# Patient Record
Sex: Male | Born: 1975
Health system: Southern US, Community
[De-identification: ages and names within clinical notes are randomized; demographics above are authoritative.]

## PROBLEM LIST (undated history)

## (undated) DIAGNOSIS — R51 Headache: Secondary | ICD-10-CM

## (undated) DIAGNOSIS — R519 Headache, unspecified: Secondary | ICD-10-CM

## (undated) HISTORY — PX: CERVICAL FUSION: SHX112

## (undated) HISTORY — PX: MYRINGOTOMY WITH TUBE PLACEMENT: SHX5663

---

## 2002-12-10 ENCOUNTER — Encounter: Payer: Self-pay | Admitting: Emergency Medicine

## 2002-12-10 ENCOUNTER — Emergency Department (HOSPITAL_COMMUNITY): Admission: EM | Admit: 2002-12-10 | Discharge: 2002-12-10 | Payer: Self-pay | Admitting: Emergency Medicine

## 2003-01-25 ENCOUNTER — Emergency Department (HOSPITAL_COMMUNITY): Admission: EM | Admit: 2003-01-25 | Discharge: 2003-01-25 | Payer: Self-pay | Admitting: Emergency Medicine

## 2003-01-26 ENCOUNTER — Encounter: Payer: Self-pay | Admitting: Emergency Medicine

## 2003-10-09 ENCOUNTER — Emergency Department (HOSPITAL_COMMUNITY): Admission: EM | Admit: 2003-10-09 | Discharge: 2003-10-09 | Payer: Self-pay

## 2005-04-07 ENCOUNTER — Emergency Department (HOSPITAL_COMMUNITY): Admission: EM | Admit: 2005-04-07 | Discharge: 2005-04-07 | Payer: Self-pay | Admitting: Emergency Medicine

## 2005-04-17 ENCOUNTER — Emergency Department (HOSPITAL_COMMUNITY): Admission: EM | Admit: 2005-04-17 | Discharge: 2005-04-17 | Payer: Self-pay | Admitting: Emergency Medicine

## 2005-04-24 ENCOUNTER — Emergency Department (HOSPITAL_COMMUNITY): Admission: EM | Admit: 2005-04-24 | Discharge: 2005-04-24 | Payer: Self-pay | Admitting: Emergency Medicine

## 2006-05-04 ENCOUNTER — Emergency Department (HOSPITAL_COMMUNITY): Admission: EM | Admit: 2006-05-04 | Discharge: 2006-05-04 | Payer: Self-pay | Admitting: Emergency Medicine

## 2006-05-14 ENCOUNTER — Encounter: Admission: RE | Admit: 2006-05-14 | Discharge: 2006-05-14 | Payer: Self-pay | Admitting: Neurosurgery

## 2006-05-29 ENCOUNTER — Inpatient Hospital Stay (HOSPITAL_COMMUNITY): Admission: RE | Admit: 2006-05-29 | Discharge: 2006-06-02 | Payer: Self-pay | Admitting: Neurosurgery

## 2007-12-16 ENCOUNTER — Emergency Department (HOSPITAL_COMMUNITY): Admission: EM | Admit: 2007-12-16 | Discharge: 2007-12-16 | Payer: Self-pay | Admitting: Emergency Medicine

## 2007-12-18 ENCOUNTER — Emergency Department (HOSPITAL_COMMUNITY): Admission: EM | Admit: 2007-12-18 | Discharge: 2007-12-18 | Payer: Self-pay | Admitting: Emergency Medicine

## 2010-12-07 NOTE — Discharge Summary (Signed)
NAME:  Malik Hernandez, Malik Hernandez                 ACCOUNT NO.:  [id number]   MEDICAL RECORD NO.:  [id number]          PATIENT TYPE:  INP   LOCATION:  3005                         FACILITY:  [facility]   PHYSICIAN:  Coletta Memos, M.D.     DATE OF BIRTH:  Jun 02, 1976   DATE OF ADMISSION:  05/29/2006  DATE OF DISCHARGE:  06/02/2006                                 DISCHARGE SUMMARY   ADMITTING DIAGNOSES:  C3-4, C4-5, C5-6 spondylosis, stenosis, cervical  spondylosis with cervical myelopathy.   POSTOPERATIVE DIAGNOSES:  C3-4, C4-5, C5-6 spondylosis, stenosis, cervical  spondylosis with cervical myelopathy.   PROCEDURE:  1. Anterior cervical diskectomy and fusion C3-C6.  2. PEAK __________  3. Anterior plating C3-C6.   INDICATIONS:  [name] is a 35 year old black gentleman with history of  cervical spondylosis with myelopathy.  He had evidence of cervical stenosis  on his MRI.  He was taken to the operating room and decompressed for that  problem.  Postoperatively, frankly he has done well.  He has and slow to  progress but has had no problems.  At discharge, he is tolerating a regular  diet, walking, voiding.  His wound is clean, dry, and there are no signs of  infection.  He was discharged home with Percocet for pain, Valium for muscle  relaxant. He will get a return appointment see [place] in approximately three  full weeks.           ______________________________  Coletta Memos, M.D.     KC/MEDQ  D:  06/02/2006  T:  06/02/2006  Job:  [id number]

## 2010-12-07 NOTE — Op Note (Signed)
NAME:  Malik Hernandez, Malik Hernandez                 ACCOUNT NO.:  [id number]   MEDICAL RECORD NO.:  [id number]          PATIENT TYPE:  INP   LOCATION:  3005                         FACILITY:  [facility]   PHYSICIAN:  Cristi Loron, M.D.DATE OF BIRTH:  Jul 10, 1976   DATE OF PROCEDURE:  05/29/2006  DATE OF DISCHARGE:                                 OPERATIVE REPORT   PREOPERATIVE DIAGNOSES:  C3-4, C4-5, C5-6 spondylosis, herniated nucleus  pulposus, stenosis, cervicalgia, cervical myelopathy, cervical  radiculopathy.   POSTOPERATIVE DIAGNOSES:  C3-4, C4-5, C5-6 spondylosis, herniated nucleus  pulposus, stenosis, cervicalgia, cervical myelopathy, cervical  radiculopathy.   PROCEDURES:  C3-4, C4-5, C5-6 extensive anterior cervical  diskectomy/decompression; C3-4, C4-5, C5-6 interbody arthrodesis with  Alphatec putty and local morcellized autograft bone; insertion of interbody  prosthesis (Alphatec PEEK prosthesis) at C3-4, 4-5 and 5-6; anterior  cervical plating, C3 to C6   SURGEON:  Cristi Loron, M.D.   ASSISTANT:  Coletta Memos, M.D.   ANESTHESIA:  General endotracheal.   ESTIMATED BLOOD LOSS:  150 cc.   SPECIMENS:  None.   DRAINS:  One 7-mm Jackson-Pratt drain in the prevertebral space.   COMPLICATIONS:  None.   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  The  patient was worked up with a cervical MRI, which demonstrated severe  stenosis at C3-4, 4-5 and 5-6 with herniated disks, spondylosis, etc., and  evidence of spinal cord gliosis.  I recommended surgery, and the patient has  weighed the risks, benefits and alternatives of surgery and decided to  proceed with a 3-level anterior cervical diskectomy, fusion and plating with  Codman Slimlock titanium plate and screws.   DESCRIPTION OF PROCEDURES:  The patient was brought to the operating room by  the anesthesia team.  General  endotracheal anesthesia was induced.  The  patient remained in a supine position.  A roll was placed under the  shoulders to place the neck in slight extension.  His anterior cervical  region was then prepared with Betadine scrub and Betadine solution.  Sterile  drapes were applied.  I then injected the area to be incised with Marcaine  with epinephrine solution.  I used a scalpel to make a transverse incision  in the patient's left anterior neck.  I used the Metzenbaum scissors to  divide the platysma, and then to dissect medial to the sternocleidomastoid  muscle, jugular vein and carotid artery.  I carefully dissected down towards  the anterior cervical spine and carefully identified the esophagus and  retracted it medially with the handheld retractors.  We used the Kitner  swabs to clear the soft tissue from the anterior cervical spine, and then  inserted a bent spinal needle into the upper exposed intervertebral disk  space.  We continued to check by radiograph to confirm our location.   We then used electrocautery to detach the medial border of the longus colli  muscle bilaterally from the C3-4, 4-5 and 5-6 intervertebral disk spaces.  We inserted the Caspar self-retaining retractor  for exposure.  We began at  C5.  We incised the C4-5 intervertebral disk with a 15-blade scalpel and  performed a partial intervertebral diskectomy using the pituitary forceps  and the Karlin curets.  We then inserted distraction screws at C4 and C5 and  then distracted the interspace.  Then, we brought the operating microscope  into the field, and under its magnification and illumination, we completed  the microdissection/decompression.  We used the high-speed drill to  decorticate the vertebral endplates at C4-5, drill away the remainder of the  C4-5 intervertebral disk, drill away some posterior spondylosis; there was  considerable posterior spondylosis.  We thinned down the posterior  longitudinal  ligament with the drill, and then we incised the ligament with  the arachnoid knife and then removed it with the Kerrison punch,  undercutting the vertebral endplates at C4-5, decompressing the thecal sac.  We then performed a foraminotomy about the bilateral C5 nerve roots,  completing the decompression at this level.   We then repeated this procedure in analogous fashion at C3-4, and then at C5-  6.   Having completed the decompression at C3-4, 4-5, and 5-6, we now turned our  attention to arthrodesis.  We obtained Alphatec medium PEEK interbody  spacers, 5 mm in height.  We prefilled them with a combination of local,  morcellized autograft bone, which was obtained during the decompression, and  cleared off the soft tissue, was Grafton bone graft extender.  We prefilled  these PEEK prostheses with the autograft bone and the putty, and then  inserted them in to distract the C3-4, 4-5 and 5-6 interspaces.  We removed  the distractor screws.  There was a good snug fit of the prosthesis at each  level.   We now turned our attention to anterior spinal instrumentation.  We used a  high-speed drill to remove some ventral spondylosis from the vertebral  endplates at C3-4, 4-5 and 5-6 so that a plate would lie down flat.  We  selected an appropriate length Codman Slimlock anterior cervical plate and  laid it along the anterior aspect of the vertebral bodies from C3 to C6.  We  then drilled two 14-mm holes at C3, 4, 5 and 6.  We secured the plate to the  intervertebral bodies by placing two 14-mm self-tapping screws at C3, 4, 5  and 6.  We then obtained intraoperative radiograph that demonstrated good  position of the plate and screws and interbody prosthesis.  We, therefore,  secured the screws to the plate by locking each cam.   We then obtained hemostasis using bipolar electrocautery.  We irrigated the wound out with Bacitracin solution.  We removed the Caspar retractor, and  then we  inspected the esophagus for damage.  There was none apparent.  We  then reapproximated the patient's platysma muscle with interrupted 3-0  Vicryl suture, the subcutaneous tissue with interrupted 3-0 Vicryl suture,  and the skin with Steri-Strips and benzoin.  I should mention that we placed  a 7-mm Jackson-Pratt drain in the prevertebral space and tunneled it out  through a separate stab wound.  We then reapproximated the patient's skin  with Steri-Strips and benzoin.  The wound was then coated with Bacitracin  ointment.  A sterile dressing was applied.  The drapes were removed, and the  patient was subsequently extubated by the anesthesia team and transported to  the postanesthesia care unit in stable condition.  All sponge, instrument  and needle counts were correct  at the end of this case.     Cristi Loron, M.D.  Electronically Signed    JDJ/MEDQ  D:  05/29/2006  T:  05/30/2006  Job:  [id number]   cc:   Coletta Memos, M.D.

## 2011-03-12 ENCOUNTER — Emergency Department (HOSPITAL_COMMUNITY): Payer: BC Managed Care – PPO

## 2011-03-12 ENCOUNTER — Emergency Department (HOSPITAL_COMMUNITY)
Admission: EM | Admit: 2011-03-12 | Discharge: 2011-03-13 | Disposition: A | Discharge status: Home / Self Care | Payer: BC Managed Care – PPO | Attending: Emergency Medicine | Admitting: Emergency Medicine

## 2011-03-12 DIAGNOSIS — S93409A Sprain of unspecified ligament of unspecified ankle, initial encounter: Secondary | ICD-10-CM | POA: Insufficient documentation

## 2011-03-12 DIAGNOSIS — Y9239 Other specified sports and athletic area as the place of occurrence of the external cause: Secondary | ICD-10-CM | POA: Insufficient documentation

## 2011-03-12 DIAGNOSIS — X500XXA Overexertion from strenuous movement or load, initial encounter: Secondary | ICD-10-CM | POA: Insufficient documentation

## 2011-03-12 DIAGNOSIS — M25473 Effusion, unspecified ankle: Secondary | ICD-10-CM | POA: Insufficient documentation

## 2011-03-12 DIAGNOSIS — M25579 Pain in unspecified ankle and joints of unspecified foot: Secondary | ICD-10-CM | POA: Insufficient documentation

## 2011-03-12 DIAGNOSIS — S8990XA Unspecified injury of unspecified lower leg, initial encounter: Secondary | ICD-10-CM | POA: Insufficient documentation

## 2011-03-12 DIAGNOSIS — Y9367 Activity, basketball: Secondary | ICD-10-CM | POA: Insufficient documentation

## 2011-03-12 DIAGNOSIS — M25476 Effusion, unspecified foot: Secondary | ICD-10-CM | POA: Insufficient documentation

## 2012-03-30 DIAGNOSIS — M5430 Sciatica, unspecified side: Secondary | ICD-10-CM | POA: Insufficient documentation

## 2012-03-30 DIAGNOSIS — Z981 Arthrodesis status: Secondary | ICD-10-CM | POA: Insufficient documentation

## 2012-03-30 DIAGNOSIS — F17200 Nicotine dependence, unspecified, uncomplicated: Secondary | ICD-10-CM | POA: Insufficient documentation

## 2012-03-31 ENCOUNTER — Emergency Department (HOSPITAL_COMMUNITY)
Admission: EM | Admit: 2012-03-31 | Discharge: 2012-03-31 | Disposition: A | Discharge status: Home / Self Care | Payer: BC Managed Care – PPO | Attending: Emergency Medicine | Admitting: Emergency Medicine

## 2012-03-31 ENCOUNTER — Encounter (HOSPITAL_COMMUNITY): Payer: Self-pay

## 2012-03-31 DIAGNOSIS — M5430 Sciatica, unspecified side: Secondary | ICD-10-CM

## 2012-03-31 MED ORDER — HYDROCODONE-ACETAMINOPHEN 5-325 MG PO TABS: 15.0000 | ORAL_TABLET | ORAL | Status: DC | PRN

## 2012-03-31 MED ORDER — IBUPROFEN 200 MG PO TABS
ORAL_TABLET | ORAL | Status: AC
  Administered 2012-03-31: 600 mg via ORAL
  Filled 2012-03-31: qty 3

## 2012-03-31 MED ORDER — CYCLOBENZAPRINE HCL 10 MG PO TABS: 10.0000 mg | ORAL_TABLET | Freq: Two times a day (BID) | ORAL | Status: AC | PRN

## 2012-03-31 MED ORDER — IBUPROFEN 200 MG PO TABS
600.0000 mg | ORAL_TABLET | Freq: Once | ORAL | Status: AC
  Administered 2012-03-31: 600 mg via ORAL

## 2012-03-31 NOTE — ED Notes (Signed)
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

## 2012-03-31 NOTE — ED Provider Notes (Signed)
History     CSN: [id number]  Arrival date & time 03/30/12  2358   First MD Initiated Contact with Patient 03/31/12 [phone]      Chief Complaint  Patient presents with  . 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  History reviewed. No pertinent past medical history.  Past Surgical History  Procedure Date  . Cervical fusion     No family history on file.  History  Substance Use Topics  . Smoking status: Current Some Day Smoker    Types: Cigars  . Smokeless tobacco: Not on file  . Alcohol Use: Yes     social      Review of Systems  Allergies  Review of patient's allergies indicates no known allergies.  Home Medications   Current Outpatient Rx  Name Route Sig Dispense Refill  . CYCLOBENZAPRINE HCL 10 MG PO TABS Oral Take 1 tablet (10 mg total) by mouth 2 (two) times daily as needed for muscle spasms. 20 tablet 0  . HYDROCODONE-ACETAMINOPHEN 5-325 MG PO TABS Oral Take 15 tablets by mouth every 4 (four) hours as needed for pain. 6 tablet 0    BP 117/69  Pulse 71  Temp 98 F (36.7 C) (Oral)  Resp 20  Ht 5\' 9"  (1.753 m)  Wt 185 lb (83.915 kg)  BMI 27.32 kg/m2  SpO2 99%  Physical Exam  Nursing note and vitals reviewed. Constitutional: He is oriented to person, place, and time. He appears well-developed. No distress.  HENT:  Head: Normocephalic and atraumatic.  Eyes: Pupils are equal, round, and reactive to light.  Neck: Normal range of motion.  Cardiovascular: Normal rate and intact distal pulses.   Pulmonary/Chest: No respiratory distress.  Abdominal: Normal appearance. He exhibits no distension.  Musculoskeletal: Normal range of motion.       Back:  Neurological: He is alert and oriented to person, place, and time. He displays normal reflexes. No cranial nerve deficit.   Skin: Skin is warm and dry. No rash noted.  Psychiatric: He has a normal mood and affect. His behavior is normal.    ED Course  Procedures (including critical care time)  Labs Reviewed - No data to display No results found.   1. Sciatica       MDM          Nelia Shi, MD 03/31/12 1600

## 2012-03-31 NOTE — ED Notes (Signed)
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

## 2012-04-04 ENCOUNTER — Emergency Department (HOSPITAL_COMMUNITY)
Admission: EM | Admit: 2012-04-04 | Discharge: 2012-04-04 | Disposition: A | Discharge status: Home / Self Care | Payer: BC Managed Care – PPO | Attending: Emergency Medicine | Admitting: Emergency Medicine

## 2012-04-04 ENCOUNTER — Encounter (HOSPITAL_COMMUNITY): Payer: Self-pay | Admitting: Emergency Medicine

## 2012-04-04 DIAGNOSIS — F17200 Nicotine dependence, unspecified, uncomplicated: Secondary | ICD-10-CM | POA: Insufficient documentation

## 2012-04-04 DIAGNOSIS — M5430 Sciatica, unspecified side: Secondary | ICD-10-CM | POA: Insufficient documentation

## 2012-04-04 DIAGNOSIS — M5432 Sciatica, left side: Secondary | ICD-10-CM

## 2012-04-04 MED ORDER — PREDNISONE 20 MG PO TABS
60.0000 mg | ORAL_TABLET | Freq: Once | ORAL | Status: AC
  Administered 2012-04-04: 60 mg via ORAL
  Filled 2012-04-04: qty 3

## 2012-04-04 MED ORDER — DIAZEPAM 5 MG PO TABS: 5.0000 mg | ORAL_TABLET | Freq: Three times a day (TID) | ORAL | Status: AC | PRN

## 2012-04-04 MED ORDER — HYDROCODONE-ACETAMINOPHEN 5-325 MG PO TABS: 1.0000 | ORAL_TABLET | Freq: Every evening | ORAL | Status: AC | PRN

## 2012-04-04 MED ORDER — DIAZEPAM 5 MG PO TABS
5.0000 mg | ORAL_TABLET | Freq: Once | ORAL | Status: AC
  Administered 2012-04-04: 5 mg via ORAL
  Filled 2012-04-04: qty 1

## 2012-04-04 MED ORDER — PREDNISONE 20 MG PO TABS: ORAL_TABLET | ORAL | Status: AC

## 2012-04-04 NOTE — ED Notes (Signed)
Pt c/o L lower back pain radiating down L thigh to lower leg. Also c/o L numbness/tingling. Denies injury. Pt states seen here for same recently. No better

## 2012-04-04 NOTE — ED Provider Notes (Signed)
History     CSN: [id number]  Arrival date & time 04/04/12  1191   First MD Initiated Contact with Patient 04/04/12 0308      Chief Complaint  Patient presents with  . Back Pain    (Consider location/radiation/quality/duration/timing/severity/associated sxs/prior treatment) HPI Comments: Patient was recently diagnosed with sciatica, radiating down his left leg.  He was treated with Flexeril, and hydrocodone, and received no relief.  He still having, exacerbation, or pain, numbness and tingling.  That starts in his mid left buttock radiating down the lateral thigh, to the knee.  He denies any known injury, but he does move furniture for a living.  Patient is a 36 y.o. male presenting with back pain. The history is provided by the patient.  Back Pain  This is a recurrent problem. The current episode started more than 1 week ago. The problem occurs constantly. The problem has been gradually worsening. The pain is associated with no known injury. The pain is present in the lumbar spine. The quality of the pain is described as aching. The pain radiates to the left thigh. The pain is at a severity of 7/10. The pain is moderate. The symptoms are aggravated by certain positions. Pertinent negatives include no numbness, no bowel incontinence, no perianal numbness, no bladder incontinence, no dysuria, no paresis, no tingling and no weakness.    History reviewed. No pertinent past medical history.  Past Surgical History  Procedure Date  . Cervical fusion     No family history on file.  History  Substance Use Topics  . Smoking status: Current Some Day Smoker    Types: Cigars  . Smokeless tobacco: Not on file  . Alcohol Use: Yes     social      Review of Systems  Respiratory: Negative for shortness of breath.   Gastrointestinal: Negative for nausea, diarrhea and bowel incontinence.  Genitourinary: Negative for bladder incontinence and dysuria.  Musculoskeletal: Positive for back pain.   Skin: Negative for rash and wound.  Neurological: Negative for dizziness, tingling, weakness and numbness.    Allergies  Review of patient's allergies indicates no known allergies.  Home Medications   Current Outpatient Rx  Name Route Sig Dispense Refill  . CYCLOBENZAPRINE HCL 10 MG PO TABS Oral Take 1 tablet (10 mg total) by mouth 2 (two) times daily as needed for muscle spasms. 20 tablet 0  . DIAZEPAM 5 MG PO TABS Oral Take 1 tablet (5 mg total) by mouth every 8 (eight) hours as needed for anxiety. 30 tablet 0  . HYDROCODONE-ACETAMINOPHEN 5-325 MG PO TABS Oral Take 1 tablet by mouth at bedtime and may repeat dose one time if needed. 6 tablet 0  . PREDNISONE 20 MG PO TABS  3 Tabs PO Days 1-3, then 2 tabs PO Days 4-6, then 1 tab PO Day 7-9, then Half Tab PO Day 10-12 20 tablet 0    BP 125/78  Pulse 81  Temp 98.7 F (37.1 C) (Oral)  Resp 16  SpO2 100%  Physical Exam  Constitutional: He is oriented to person, place, and time. He appears well-developed and well-nourished.  HENT:  Head: Normocephalic.  Eyes: Pupils are equal, round, and reactive to light.  Neck: Normal range of motion.  Pulmonary/Chest: Effort normal.  Abdominal: Soft. There is no tenderness.  Musculoskeletal: Normal range of motion. He exhibits no tenderness.       Lumbar back: He exhibits tenderness. He exhibits normal range of motion, no bony tenderness, no deformity,  no pain and no spasm.       Pain left SI joint  Neurological: He is alert and oriented to person, place, and time.  Skin: Skin is warm. No rash noted. No erythema.    ED Course  Procedures (including critical care time)  Labs Reviewed - No data to display No results found.   1. Sciatica of left side       MDM  Persistent sciatica, treated with Flexeril, and Vicodin without relief.  Will change to Valium, prednisone, over a 12 day taper, and Vicodin at night         Arman Filter, NP 04/04/12 0401  Arman Filter,  NP 04/04/12 3086

## 2012-04-04 NOTE — ED Notes (Signed)
NP at bedside.

## 2012-04-04 NOTE — ED Provider Notes (Signed)
Medical screening examination/treatment/procedure(s) were performed by non-physician practitioner and as supervising physician I was immediately available for consultation/collaboration.  Olivia Mackie, MD 04/04/12 [phone]

## 2013-02-07 ENCOUNTER — Encounter (HOSPITAL_COMMUNITY): Payer: Self-pay | Admitting: Emergency Medicine

## 2013-02-07 ENCOUNTER — Emergency Department (HOSPITAL_COMMUNITY)
Admission: EM | Admit: 2013-02-07 | Discharge: 2013-02-08 | Disposition: A | Discharge status: Home / Self Care | Payer: Self-pay | Attending: Emergency Medicine | Admitting: Emergency Medicine

## 2013-02-07 DIAGNOSIS — H6001 Abscess of right external ear: Secondary | ICD-10-CM

## 2013-02-07 DIAGNOSIS — H60399 Other infective otitis externa, unspecified ear: Secondary | ICD-10-CM | POA: Insufficient documentation

## 2013-02-07 DIAGNOSIS — Y929 Unspecified place or not applicable: Secondary | ICD-10-CM | POA: Insufficient documentation

## 2013-02-07 DIAGNOSIS — L089 Local infection of the skin and subcutaneous tissue, unspecified: Secondary | ICD-10-CM | POA: Insufficient documentation

## 2013-02-07 DIAGNOSIS — F17200 Nicotine dependence, unspecified, uncomplicated: Secondary | ICD-10-CM | POA: Insufficient documentation

## 2013-02-07 DIAGNOSIS — Y9389 Activity, other specified: Secondary | ICD-10-CM | POA: Insufficient documentation

## 2013-02-07 NOTE — ED Notes (Signed)
C/o abscess to R ear x 1 week.  Pt states he thinks he was bit by something at work.

## 2013-02-08 NOTE — ED Provider Notes (Signed)
   History    CSN: [id number] Arrival date & time 02/07/13  2317  First MD Initiated Contact with Patient 02/07/13 2354     Chief Complaint  Patient presents with  . Insect Bite   (Consider location/radiation/quality/duration/timing/severity/associated sxs/prior Treatment) HPI Comments: Patient noticed a week ago, that he had a small lesion on his right earlobe that is progressively gotten larger, and draining intermittently since that time.  It is painful.  Denies any fever or headache`  The history is provided by the patient.   History reviewed. No pertinent past medical history. Past Surgical History  Procedure Laterality Date  . Cervical fusion     No family history on file. History  Substance Use Topics  . Smoking status: Current Some Day Smoker    Types: Cigars  . Smokeless tobacco: Not on file  . Alcohol Use: Yes     Comment: social    Review of Systems  Constitutional: Negative for fever and chills.  Skin: Positive for wound.  All other systems reviewed and are negative.    Allergies  Review of patient's allergies indicates no known allergies.  Home Medications  No current outpatient prescriptions on file. BP 121/63  Pulse 78  Temp(Src) 98.3 F (36.8 C) (Oral)  Resp 16  SpO2 100% Physical Exam  Nursing note and vitals reviewed. Constitutional: He appears well-developed and well-nourished.  HENT:  Head: Normocephalic.  Right Ear: External ear normal.  Left Ear: External ear normal.  Ears:  Eyes: Pupils are equal, round, and reactive to light.  Neck: Normal range of motion.  Cardiovascular: Normal rate.   Pulmonary/Chest: Effort normal.  Musculoskeletal: Normal range of motion.  Skin: Skin is warm and dry.    ED Course  INCISION AND DRAINAGE Date/Time: 02/08/2013 12:23 AM Performed by: Arman Filter Authorized by: Arman Filter Consent: Verbal consent obtained. Consent given by: patient Patient understanding: patient states understanding  of the procedure being performed Patient identity confirmed: verbally with patient Type: abscess Body area: head/neck (Left earlobe) Anesthetic total: 0 ml Wound treatment: wound left open Patient tolerance: Patient tolerated the procedure well with no immediate complications. Comments: Material manually expressed large core, removed.  No further purulent material   (including critical care time) Labs Reviewed - No data to display No results found. 1. Abscess of earlobe, right     MDM   Abscess of the right earlobe manually expressed.  No further purulent material visible.  Patient was instructed to place warm soaks for the next one to 2 days  Arman Filter, NP 02/08/13 0025  Arman Filter, NP 02/08/13 0025

## 2013-02-08 NOTE — ED Provider Notes (Signed)
Medical screening examination/treatment/procedure(s) were performed by non-physician practitioner and as supervising physician I was immediately available for consultation/collaboration.  Mikaili Flippin M Keithen Capo, MD 02/08/13 0648 

## 2013-11-12 ENCOUNTER — Encounter (HOSPITAL_COMMUNITY): Payer: Self-pay | Admitting: Emergency Medicine

## 2013-11-12 ENCOUNTER — Emergency Department (HOSPITAL_COMMUNITY)
Admission: EM | Admit: 2013-11-12 | Discharge: 2013-11-12 | Disposition: A | Discharge status: Home / Self Care | Payer: BC Managed Care – PPO | Attending: Emergency Medicine | Admitting: Emergency Medicine

## 2013-11-12 DIAGNOSIS — Z79899 Other long term (current) drug therapy: Secondary | ICD-10-CM | POA: Insufficient documentation

## 2013-11-12 DIAGNOSIS — Z7982 Long term (current) use of aspirin: Secondary | ICD-10-CM | POA: Insufficient documentation

## 2013-11-12 DIAGNOSIS — Z981 Arthrodesis status: Secondary | ICD-10-CM | POA: Insufficient documentation

## 2013-11-12 DIAGNOSIS — M545 Low back pain, unspecified: Secondary | ICD-10-CM | POA: Insufficient documentation

## 2013-11-12 DIAGNOSIS — F17200 Nicotine dependence, unspecified, uncomplicated: Secondary | ICD-10-CM | POA: Insufficient documentation

## 2013-11-12 DIAGNOSIS — Z791 Long term (current) use of non-steroidal anti-inflammatories (NSAID): Secondary | ICD-10-CM | POA: Insufficient documentation

## 2013-11-12 DIAGNOSIS — R300 Dysuria: Secondary | ICD-10-CM | POA: Insufficient documentation

## 2013-11-12 LAB — URINALYSIS, ROUTINE W REFLEX MICROSCOPIC
Bilirubin Urine: NEGATIVE
Glucose, UA: NEGATIVE mg/dL
Hgb urine dipstick: NEGATIVE
KETONES UR: NEGATIVE mg/dL
LEUKOCYTES UA: NEGATIVE
NITRITE: NEGATIVE
PH: 5.5 (ref 5.0–8.0)
PROTEIN: NEGATIVE mg/dL
Specific Gravity, Urine: 1.027 (ref 1.005–1.030)
UROBILINOGEN UA: 1 mg/dL (ref 0.0–1.0)

## 2013-11-12 MED ORDER — OXYCODONE-ACETAMINOPHEN 5-325 MG PO TABS
2.0000 | ORAL_TABLET | Freq: Once | ORAL | Status: AC
  Administered 2013-11-12: 2 via ORAL
  Filled 2013-11-12: qty 2

## 2013-11-12 MED ORDER — CYCLOBENZAPRINE HCL 10 MG PO TABS: 10.0000 mg | ORAL_TABLET | Freq: Three times a day (TID) | ORAL | Status: DC | PRN

## 2013-11-12 MED ORDER — OXYCODONE-ACETAMINOPHEN 5-325 MG PO TABS: 2.0000 | ORAL_TABLET | Freq: Four times a day (QID) | ORAL | Status: DC | PRN

## 2013-11-12 NOTE — ED Notes (Signed)
Patient up to bathroom

## 2013-11-12 NOTE — ED Provider Notes (Signed)
CSN: [id number]     Arrival date & time 11/12/13  0031 History   First MD Initiated Contact with Patient 11/12/13 0116     Chief Complaint  Patient presents with  . Back Pain  . 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 He is here with a one-week history of left lower back pain. The pain is sharp and worse with movement. It does not radiate. He states his become severe at its worst. He denies numbness or weakness in his legs. He denies nausea, vomiting or diarrhea. He has had some mild dysuria but no urethral discharge.  History reviewed. No pertinent past medical history. Past Surgical History  Procedure Laterality Date  . Cervical fusion     History reviewed. No pertinent family history. History  Substance Use Topics  . Smoking status: Current Some Day Smoker    Types: Cigars  . Smokeless tobacco: Not on file  . Alcohol Use: Yes     Comment: social    Review of Systems  All other systems reviewed and are negative.  Allergies  Review of patient's allergies indicates no known allergies.  Home Medications   Prior to Admission medications   Medication Sig Start Date End Date Taking? Authorizing Provider  Aspirin-Caffeine (BAYER BACK & BODY PAIN EX ST) 500-32.5 MG TABS Take 2 tablets by mouth every 6 (six) hours as needed (pain).   Yes Historical Provider, MD  methocarbamol (ROBAXIN) 500 MG tablet Take 500 mg by mouth once.   Yes Historical Provider, MD  naproxen sodium (ANAPROX) 220 MG tablet Take 440 mg by mouth 2 (two) times daily as needed (pain).   Yes Historical Provider, MD  oxyCODONE-acetaminophen (PERCOCET) 5-325 MG per tablet Take 1 tablet by mouth once.   Yes Historical Provider, MD   BP 115/63  Pulse 67  Temp(Src) 98.4 F (36.9 C) (Oral)  Resp 16  SpO2 100%  Physical Exam General: Well-developed, well-nourished male in no acute  distress; appearance consistent with age of record HENT: normocephalic; atraumatic Eyes: pupils equal, round and reactive to light; extraocular muscles intact Neck: supple Heart: regular rate and rhythm Lungs: clear to auscultation bilaterally Abdomen: soft; nondistended; nontender; no masses or hepatosplenomegaly; bowel sounds present Back: Left lower paraspinal tenderness and pain on movement of lower back; pain on straight leg raise bilaterally at about 70 Extremities: No deformity; full range of motion; pulses normal Neurologic: Awake, alert and oriented; motor function intact in all extremities and symmetric; no facial droop Skin: Warm and dry Psychiatric: Flat affect    ED Course  Procedures (including critical care time)   MDM   Nursing notes and vitals signs, including pulse oximetry, reviewed.  Summary of this visit's results, reviewed by myself:  Labs:  Results for orders placed during the hospital encounter of 11/12/13 (from the past 24 hour(s))  URINALYSIS, ROUTINE W REFLEX MICROSCOPIC     Status: None   Collection Time    11/12/13  1:27 AM      Result Value Ref Range   Color, Urine YELLOW  YELLOW   APPearance CLEAR  CLEAR   Specific Gravity, Urine 1.027  1.005 - 1.030   pH 5.5  5.0 - 8.0   Glucose, UA NEGATIVE  NEGATIVE mg/dL   Hgb urine dipstick NEGATIVE  NEGATIVE   Bilirubin Urine NEGATIVE  NEGATIVE   Ketones, ur NEGATIVE  NEGATIVE mg/dL   Protein, ur NEGATIVE  NEGATIVE  mg/dL   Urobilinogen, UA 1.0  0.0 - 1.0 mg/dL   Nitrite NEGATIVE  NEGATIVE   Leukocytes, UA NEGATIVE  NEGATIVE   2:19 AM Exam consistent with acute low back pain.       Hanley SeamenJohn L Katiejo Gilroy, MD 11/12/13 [phone]

## 2013-11-12 NOTE — ED Notes (Signed)
Dr. Molpus at bedside. 

## 2013-11-12 NOTE — ED Notes (Addendum)
Patient with c/o lower back pain,dysuria and burning upon urination x 1 week

## 2013-11-13 LAB — GC/CHLAMYDIA PROBE AMP
CT Probe RNA: NEGATIVE
GC Probe RNA: NEGATIVE

## 2014-09-19 ENCOUNTER — Encounter (HOSPITAL_COMMUNITY): Payer: Self-pay | Admitting: Emergency Medicine

## 2014-09-19 ENCOUNTER — Emergency Department (HOSPITAL_COMMUNITY)
Admission: EM | Admit: 2014-09-19 | Discharge: 2014-09-19 | Disposition: A | Discharge status: Home / Self Care | Payer: BLUE CROSS/BLUE SHIELD | Attending: Emergency Medicine | Admitting: Emergency Medicine

## 2014-09-19 ENCOUNTER — Emergency Department (HOSPITAL_COMMUNITY): Payer: BLUE CROSS/BLUE SHIELD

## 2014-09-19 DIAGNOSIS — R0789 Other chest pain: Secondary | ICD-10-CM | POA: Diagnosis not present

## 2014-09-19 DIAGNOSIS — R079 Chest pain, unspecified: Secondary | ICD-10-CM | POA: Diagnosis present

## 2014-09-19 DIAGNOSIS — Z720 Tobacco use: Secondary | ICD-10-CM | POA: Diagnosis not present

## 2014-09-19 DIAGNOSIS — Z79899 Other long term (current) drug therapy: Secondary | ICD-10-CM | POA: Diagnosis not present

## 2014-09-19 LAB — CBC
HCT: 43.2 % (ref 39.0–52.0)
HEMOGLOBIN: 15.1 g/dL (ref 13.0–17.0)
MCH: 31.5 pg (ref 26.0–34.0)
MCHC: 35 g/dL (ref 30.0–36.0)
MCV: 90 fL (ref 78.0–100.0)
PLATELETS: 188 10*3/uL (ref 150–400)
RBC: 4.8 MIL/uL (ref 4.22–5.81)
RDW: 13.4 % (ref 11.5–15.5)
WBC: 6.7 10*3/uL (ref 4.0–10.5)

## 2014-09-19 LAB — BASIC METABOLIC PANEL
Anion gap: 9 (ref 5–15)
BUN: 15 mg/dL (ref 6–23)
CHLORIDE: 106 mmol/L (ref 96–112)
CO2: 26 mmol/L (ref 19–32)
CREATININE: 0.97 mg/dL (ref 0.50–1.35)
Calcium: 8.8 mg/dL (ref 8.4–10.5)
GFR calc non Af Amer: >90 mL/min (ref >90)
GLUCOSE: 154 mg/dL — AB (ref 70–99)
Potassium: 3.8 mmol/L (ref 3.5–5.1)
Sodium: 141 mmol/L (ref 135–145)

## 2014-09-19 LAB — I-STAT TROPONIN, ED: Troponin i, poc: 0 ng/mL (ref 0.00–0.08)

## 2014-09-19 MED ORDER — KETOROLAC TROMETHAMINE 60 MG/2ML IM SOLN
60.0000 mg | Freq: Once | INTRAMUSCULAR | Status: AC
  Administered 2014-09-19: 60 mg via INTRAMUSCULAR
  Filled 2014-09-19: qty 2

## 2014-09-19 MED ORDER — IBUPROFEN 600 MG PO TABS: 600.0000 mg | ORAL_TABLET | Freq: Three times a day (TID) | ORAL | Status: DC | PRN

## 2014-09-19 MED ORDER — OXYCODONE-ACETAMINOPHEN 5-325 MG PO TABS
1.0000 | ORAL_TABLET | Freq: Once | ORAL | Status: AC
  Administered 2014-09-19: 1 via ORAL
  Filled 2014-09-19: qty 1

## 2014-09-19 MED ORDER — HYDROCODONE-ACETAMINOPHEN 5-325 MG PO TABS: 1.0000 | ORAL_TABLET | ORAL | Status: DC | PRN

## 2014-09-19 NOTE — ED Notes (Signed)
Pt back from xray at this time. Placed back on cardiac monitor, BP cuff, pulse ox reapplied. Will continue to monitor.

## 2014-09-19 NOTE — Discharge Instructions (Signed)

## 2014-09-19 NOTE — ED Notes (Signed)
Pt to Xray at this time

## 2014-09-19 NOTE — ED Notes (Signed)
Pt presents to the ED complaining of chest pain and SOB. Chest pain began at 0700 on 09/19/14.Pt states that chest pain is in L Upper chest area radiating towards L side and L back. Pt states that SOB is occasional. Pt. Also states that chest pain is worse when moving or while taking deep breaths.
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
## 2014-09-19 NOTE — ED Notes (Signed)
Pt co chest pain left side radiating to back. Denies dizz or SOB. No hx HTN or family history of MI.

## 2014-11-09 ENCOUNTER — Other Ambulatory Visit: Payer: Self-pay | Admitting: Orthopaedic Surgery

## 2014-11-09 DIAGNOSIS — M25561 Pain in right knee: Secondary | ICD-10-CM

## 2014-11-20 ENCOUNTER — Other Ambulatory Visit: Payer: BLUE CROSS/BLUE SHIELD

## 2014-11-24 ENCOUNTER — Inpatient Hospital Stay: Admission: RE | Admit: 2014-11-24 | Payer: BLUE CROSS/BLUE SHIELD | Source: Ambulatory Visit

## 2015-02-05 ENCOUNTER — Encounter (HOSPITAL_COMMUNITY): Payer: Self-pay

## 2015-02-05 ENCOUNTER — Emergency Department (HOSPITAL_COMMUNITY)
Admission: EM | Admit: 2015-02-05 | Discharge: 2015-02-05 | Disposition: A | Discharge status: Home / Self Care | Payer: BLUE CROSS/BLUE SHIELD | Attending: Emergency Medicine | Admitting: Emergency Medicine

## 2015-02-05 DIAGNOSIS — K088 Other specified disorders of teeth and supporting structures: Secondary | ICD-10-CM | POA: Diagnosis present

## 2015-02-05 DIAGNOSIS — Z720 Tobacco use: Secondary | ICD-10-CM | POA: Diagnosis not present

## 2015-02-05 DIAGNOSIS — K029 Dental caries, unspecified: Secondary | ICD-10-CM | POA: Insufficient documentation

## 2015-02-05 DIAGNOSIS — Z79899 Other long term (current) drug therapy: Secondary | ICD-10-CM | POA: Insufficient documentation

## 2015-02-05 DIAGNOSIS — K047 Periapical abscess without sinus: Secondary | ICD-10-CM

## 2015-02-05 MED ORDER — OXYCODONE-ACETAMINOPHEN 5-325 MG PO TABS
2.0000 | ORAL_TABLET | Freq: Once | ORAL | Status: AC
  Administered 2015-02-05: 2 via ORAL
  Filled 2015-02-05: qty 2

## 2015-02-05 MED ORDER — PENICILLIN V POTASSIUM 500 MG PO TABS: 500.0000 mg | ORAL_TABLET | Freq: Four times a day (QID) | ORAL | Status: AC

## 2015-02-05 MED ORDER — OXYCODONE-ACETAMINOPHEN 5-325 MG PO TABS: 2.0000 | ORAL_TABLET | ORAL | Status: DC | PRN

## 2015-02-05 MED ORDER — PENICILLIN V POTASSIUM 250 MG PO TABS
500.0000 mg | ORAL_TABLET | Freq: Once | ORAL | Status: AC
  Administered 2015-02-05: 500 mg via ORAL
  Filled 2015-02-05: qty 2

## 2015-02-05 NOTE — ED Provider Notes (Signed)
CSN: [id number]     Arrival date & time 02/05/15  1511 History   First MD Initiated Contact with Patient 02/05/15 1557     Chief Complaint  Patient presents with  . Dental Pain     (Consider location/radiation/quality/duration/timing/severity/associated sxs/prior Treatment) Patient is a 39 y.o. male presenting with tooth pain. The history is provided by the patient and the spouse. No language interpreter was used.  Dental Pain Associated symptoms: facial swelling   Associated symptoms: no fever   Malik Hernandez is a 39 year old male who presents for left upper dental pain that began 3 days ago. He states it has been difficult to eat and swallow due to pain. He denies having any previous abscesses or infections of the teeth or face. He states he has taken ibuprofen with minimal relief. His wife states that he has an appointment with a dentist next week but could not wait until then and would like to go back to work. He denies any fever, chills, difficulty breathing, shortness of breath.  History reviewed. No pertinent past medical history. Past Surgical History  Procedure Laterality Date  . Cervical fusion     History reviewed. No pertinent family history. History  Substance Use Topics  . Smoking status: Current Some Day Smoker    Types: Cigars  . Smokeless tobacco: Not on file  . Alcohol Use: Yes     Comment: social    Review of Systems  Constitutional: Negative for fever and chills.  HENT: Positive for facial swelling. Negative for sore throat and trouble swallowing.   Respiratory: Negative for shortness of breath.       Allergies  Review of patient's allergies indicates no known allergies.  Home Medications   Prior to Admission medications   Medication Sig Start Date End Date Taking? Authorizing Provider  cyclobenzaprine (FLEXERIL) 10 MG tablet Take 1 tablet (10 mg total) by mouth 3 (three) times daily as needed for muscle spasms. Patient not taking: Reported on 09/19/2014  11/12/13   Paula LibraJohn Molpus, MD  HYDROcodone-acetaminophen (NORCO/VICODIN) 5-325 MG per tablet Take 1 tablet by mouth every 4 (four) hours as needed for moderate pain. 09/19/14   Azalia BilisKevin Campos, MD  ibuprofen (ADVIL,MOTRIN) 600 MG tablet Take 1 tablet (600 mg total) by mouth every 8 (eight) hours as needed. 09/19/14   Azalia BilisKevin Campos, MD  oxyCODONE-acetaminophen (PERCOCET/ROXICET) 5-325 MG per tablet Take 2 tablets by mouth every 4 (four) hours as needed for severe pain. 02/05/15   Petros Ahart Patel-Mills, PA-C  penicillin v potassium (VEETID) 500 MG tablet Take 1 tablet (500 mg total) by mouth 4 (four) times daily. 02/05/15 02/12/15  Arlenne Kimbley Patel-Mills, PA-C   BP 117/82 mmHg  Pulse 70  Temp(Src) 98.5 F (36.9 C) (Oral)  Resp 20  SpO2 99% Physical Exam  Constitutional: He is oriented to person, place, and time. He appears well-developed and well-nourished. No distress.  HENT:  Head: Normocephalic and atraumatic.  Mouth/Throat: Oropharynx is clear and moist and mucous membranes are normal. No trismus in the jaw. Abnormal dentition. Dental abscesses and dental caries present. No uvula swelling. No oropharyngeal exudate or tonsillar abscesses.    Eyes: Conjunctivae are normal.  Neck: Normal range of motion. Neck supple.  No anterior cervical swelling.  Cardiovascular: Normal rate.   Pulmonary/Chest: Effort normal. No respiratory distress.  Abdominal: Soft.  Musculoskeletal: Normal range of motion.  Neurological: He is alert and oriented to person, place, and time.  Skin: Skin is warm and dry.  Psychiatric: He has a normal  mood and affect. His behavior is normal.  Nursing note and vitals reviewed.   ED Course  Procedures (including critical care time) Labs Review Labs Reviewed - No data to display  Imaging Review No results found.   EKG Interpretation None      MDM   Final diagnoses:  Dental abscess  Patient refused injection to aspirate abscess along the gumline. He has no signs of Luwigs  angina. I have given the patient penicillin as well as Percocet until his follow-up appointment with his dentist next week. I also gave the patient a work note. I discussed return precautions such as fever, increased facial swelling, difficulty breathing or swallowing. Patient verbally agrees with the plan.     Catha Gosselin, PA-C 02/05/15 1642  Richardean Canal, MD 02/05/15 [phone]

## 2015-02-05 NOTE — ED Notes (Signed)
Pt reports onset several days left upper tooth and gum pain and swelling.

## 2015-02-05 NOTE — Discharge Instructions (Signed)
Dental Abscess Return for fever, increased facial swelling, difficulty breathing or swallowing. Take anabiotic's and pain medicine as prescribed. A dental abscess is a collection of infected fluid (pus) from a bacterial infection in the inner part of the tooth (pulp). It usually occurs at the end of the tooth's root.  CAUSES   Severe tooth decay.  Trauma to the tooth that allows bacteria to enter into the pulp, such as a broken or chipped tooth. SYMPTOMS   Severe pain in and around the infected tooth.  Swelling and redness around the abscessed tooth or in the mouth or face.  Tenderness.  Pus drainage.  Bad breath.  Bitter taste in the mouth.  Difficulty swallowing.  Difficulty opening the mouth.  Nausea.  Vomiting.  Chills.  Swollen neck glands. DIAGNOSIS   A medical and dental history will be taken.  An examination will be performed by tapping on the abscessed tooth.  X-rays may be taken of the tooth to identify the abscess. TREATMENT The goal of treatment is to eliminate the infection. You may be prescribed antibiotic medicine to stop the infection from spreading. A root canal may be performed to save the tooth. If the tooth cannot be saved, it may be pulled (extracted) and the abscess may be drained.  HOME CARE INSTRUCTIONS  Only take over-the-counter or prescription medicines for pain, fever, or discomfort as directed by your caregiver.  Rinse your mouth (gargle) often with salt water ( tsp salt in 8 oz [250 ml] of warm water) to relieve pain or swelling.  Do not drive after taking pain medicine (narcotics).  Do not apply heat to the outside of your face.  Return to your dentist for further treatment as directed. SEEK MEDICAL CARE IF:  Your pain is not helped by medicine.  Your pain is getting worse instead of better. SEEK IMMEDIATE MEDICAL CARE IF:  You have a fever or persistent symptoms for more than 2-3 days.  You have a fever and your symptoms  suddenly get worse.  You have chills or a very bad headache.  You have problems breathing or swallowing.  You have trouble opening your mouth.  You have swelling in the neck or around the eye. Document Released: 07/08/2005 Document Revised: 04/01/2012 Document Reviewed: 10/16/2010 [facility] Patient Information 2015 [place] [place]. This information is not intended to replace advice given to you by your health care provider. Make sure you discuss any questions you have with your health care provider.

## 2015-02-05 NOTE — ED Notes (Signed)
Patient is alert and orientedx4.  Patient was explained discharge instructions and they understood them with no questions.  The patient's wife, Malik Hernandez is taking the patient home.

## 2015-12-19 ENCOUNTER — Emergency Department (HOSPITAL_COMMUNITY)
Admission: EM | Admit: 2015-12-19 | Discharge: 2015-12-19 | Disposition: A | Discharge status: Home / Self Care | Payer: BLUE CROSS/BLUE SHIELD | Attending: Emergency Medicine | Admitting: Emergency Medicine

## 2015-12-19 DIAGNOSIS — K029 Dental caries, unspecified: Secondary | ICD-10-CM | POA: Diagnosis not present

## 2015-12-19 DIAGNOSIS — F17210 Nicotine dependence, cigarettes, uncomplicated: Secondary | ICD-10-CM | POA: Insufficient documentation

## 2015-12-19 DIAGNOSIS — K0889 Other specified disorders of teeth and supporting structures: Secondary | ICD-10-CM | POA: Diagnosis present

## 2015-12-19 MED ORDER — PENICILLIN V POTASSIUM 500 MG PO TABS: 500.0000 mg | ORAL_TABLET | Freq: Four times a day (QID) | ORAL | Status: AC

## 2015-12-19 MED ORDER — NAPROXEN 500 MG PO TABS: 500.0000 mg | ORAL_TABLET | Freq: Two times a day (BID) | ORAL | Status: DC

## 2015-12-19 MED ORDER — PENICILLIN V POTASSIUM 250 MG PO TABS
500.0000 mg | ORAL_TABLET | Freq: Once | ORAL | Status: AC
  Administered 2015-12-19: 500 mg via ORAL
  Filled 2015-12-19: qty 2

## 2015-12-19 MED ORDER — NAPROXEN 250 MG PO TABS
500.0000 mg | ORAL_TABLET | Freq: Once | ORAL | Status: AC
  Administered 2015-12-19: 500 mg via ORAL
  Filled 2015-12-19: qty 2

## 2015-12-19 MED FILL — NAPROXEN 500 MG TABLET: 500 | 15 days supply | Qty: 30 | Fill #0

## 2015-12-19 MED FILL — PENICILLIN VK 500 MG TABLET: 500 | 7 days supply | Qty: 28 | Fill #0

## 2015-12-19 NOTE — ED Provider Notes (Signed)
CSN: [id number]     Arrival date & time 12/19/15  [id number] History   First MD Initiated Contact with Patient 12/19/15 0700     Chief Complaint  Patient presents with  . Dental Pain     HPI  Malik Hernandez is an 40 y.o. male with no significant PMH who presents to the ED for evaluation of dental pain. He states he noticed 7/10 constant upper left dental pain and facial swelling since last night. He has not tried anything to alleviate his symptoms. He states he knows he has poor dentition but has not been able to afford to go see a dentist. He denies dysphagia, trismus, drooling, fever, chills.   No past medical history on file. Past Surgical History  Procedure Laterality Date  . Cervical fusion     No family history on file. Social History  Substance Use Topics  . Smoking status: Current Some Day Smoker    Types: Cigars  . Smokeless tobacco: Not on file  . Alcohol Use: Yes     Comment: social    Review of Systems  All other systems reviewed and are negative.     Allergies  Review of patient's allergies indicates no known allergies.  Home Medications   Prior to Admission medications   Medication Sig Start Date End Date Taking? Authorizing Provider  cyclobenzaprine (FLEXERIL) 10 MG tablet Take 1 tablet (10 mg total) by mouth 3 (three) times daily as needed for muscle spasms. Patient not taking: Reported on 09/19/2014 11/12/13   Paula LibraJohn Molpus, MD  HYDROcodone-acetaminophen (NORCO/VICODIN) 5-325 MG per tablet Take 1 tablet by mouth every 4 (four) hours as needed for moderate pain. 09/19/14   Azalia BilisKevin Campos, MD  ibuprofen (ADVIL,MOTRIN) 600 MG tablet Take 1 tablet (600 mg total) by mouth every 8 (eight) hours as needed. 09/19/14   Azalia BilisKevin Campos, MD  naproxen (NAPROSYN) 500 MG tablet Take 1 tablet (500 mg total) by mouth 2 (two) times daily. 12/19/15   Ace GinsSerena Y Jaiyanna Safran, PA-C  oxyCODONE-acetaminophen (PERCOCET/ROXICET) 5-325 MG per tablet Take 2 tablets by mouth every 4 (four) hours as needed for  severe pain. 02/05/15   Hanna Patel-Mills, PA-C  penicillin v potassium (VEETID) 500 MG tablet Take 1 tablet (500 mg total) by mouth 4 (four) times daily. 12/19/15 12/26/15  Ace GinsSerena Y Marri Mcneff, PA-C   BP 120/87 mmHg  Pulse 56  Temp(Src) 98.4 F (36.9 C) (Oral)  Resp 16  Ht 5\' 10"  (1.778 m)  Wt 86.183 kg  BMI 27.26 kg/m2 Physical Exam  Constitutional: He is oriented to person, place, and time. No distress.  HENT:  Head: Atraumatic.  Right Ear: External ear normal.  Left Ear: External ear normal.  Nose: Nose normal.  Mild left maxillary facial edema and tenderness. Generally poor dentition and boggy gingiva. No gross abscesses visible. Uvula midline. No trismus.  Eyes: Conjunctivae are normal. No scleral icterus.  Neck: Normal range of motion. Neck supple.  Cardiovascular: Normal rate and regular rhythm.   Pulmonary/Chest: Effort normal. No respiratory distress. He exhibits no tenderness.  Abdominal: Soft. He exhibits no distension.  Neurological: He is alert and oriented to person, place, and time.  Skin: Skin is warm and dry. He is not diaphoretic.  Psychiatric: He has a normal mood and affect. His behavior is normal.  Nursing note and vitals reviewed.   ED Course  Procedures (including critical care time) Labs Review Labs Reviewed - No data to display  Imaging Review No results found. I have personally  reviewed and evaluated these images and lab results as part of my medical decision-making.   EKG Interpretation None      MDM   Final diagnoses:  Pain due to dental caries    Pt with grossly poor dentition here with acute pain and facial swelling. He is tolerating secretions with no evidence of airway compromise. He is afebrile and nontoxic appearing. He declines dental block today. Will give rx for Pen VK and dental resource guide. Naproxen for pain. I did discuss with pt that he will need to see a dentist/OMFS for ultimate treatment. ER return precautions given.    Carlene Coria, PA-C 12/19/15 1610  Gilda Crease, MD 12/19/15 [phone]

## 2015-12-19 NOTE — ED Notes (Signed)
Patient arrives with complaint of left upper dental swelling and pain. Onset last night.

## 2015-12-19 NOTE — Discharge Instructions (Signed)
Community Resource Guide Dental °The [employer]’s “211” is a great source of information about community services available.  Access by dialing 2-1-1 from anywhere in [place], or by website -  www.nc211.org.  ° °Other Local Resources (Updated 07/2015) ° °Dental  Care °  °Services ° °  °Phone Number and Address  °Cost  °[facility] For children 0 - 38 years of age:  °• Cleaning °• Tooth brushing/flossing instruction °• Sealants, fillings, crowns °• Extractions °• Emergency treatment  [phone] °[address] °[place], [place] [address] Charges based on family income.  Medicaid and some insurance plans accepted.   °  °[facility] [place] • Cleaning °• Sealants, fillings, crowns °• Extractions °• Emergency treatment [phone] °[address] °[place], [place] ° Pregnant women 18 years of age or older with a Medicaid card  °[facility] [place] • Cleaning °• Sealants, fillings, crowns °• Extractions °• Emergency treatment [phone] °[address] °[place], [place] Pregnant women 18 years of age or older with a Medicaid card  °[facility] For children 0 - 38 years of age:  °• Cleaning °• Tooth brushing/flossing instruction °• Sealants, fillings, crowns °• Extractions °• Emergency treatment °Limited orthodontic services for patients with Medicaid [phone] °[address] °[place], [place] [address] Medicaid and [place] Health Choice cover for children up to age 38 and pregnant women.  Parents of children up to age 38 without Medicaid pay a reduced fee at time of service.  °[facility] [place] For children 0 - 38 years of age:  °• Cleaning °• Tooth brushing/flossing instruction °• Sealants, fillings, crowns °• Extractions °• Emergency treatment °Limited orthodontic services for patients with Medicaid  [phone] °[address] °[place], [place].  Medicaid and [place] Health Choice cover for children up to age 38 and pregnant women.  Parents of children up to age 38 without Medicaid pay a reduced fee.  °[facility] • Cleaning °• Sealants, fillings, crowns °• Extractions ° °Hours: Tuesdays and Thursdays, 4:15 - 8 pm [phone] °[address] °[place], [place] [address] Services free of charge to [place] residents ages 18-64 who do not have health insurance, Medicare, Medicaid, or VA benefits and fall within federal poverty guidelines  °[facility] ° ° ° Provides dental care in addition to primary medical care, nutritional counseling, and pharmacy: °• Cleaning °• Sealants, fillings, crowns °• Extractions ° ° ° ° ° ° ° ° ° ° ° ° ° ° ° ° ° [phone] °[facility], [address] °[place], [place] ° °[phone] °[facility], [address] [place], [place] ° °[phone] °[facility] °[place], [place] ° °[phone] °[facility], [address] °[place], [place] ° °[phone] °[facility] °[address] °[place], [place] Accepts Medicaid, Medicare, most insurance.  Also provides services available to all with fees adjusted based on ability to pay.    °[facility] • Cleaning °• Tooth brushing/flossing instruction °• Sealants, fillings, crowns °• Extractions °• Emergency treatment °Hours: Tuesdays, Thursdays, and Fridays from 8 am to 5 pm by appointment only. [phone] °[address] °[place], [place] [address] [place] residents with Medicaid (depending on eligibility) and children with [place] Health Choice - call for more information.  °  [facility] • Extractions only ° °Hours: 2nd and 4th Thursday of each month from 6:30 am - 9 am.   [phone] ext. [phone] °[address] °[place], [place] [address] Ages 18 and older only.  Patients are seen on a first come, first served basis.  °[facility] • Cleanings °• Fillings °• Extractions °• Orthodontics °• Endodontics °• Implants/Crowns/Bridges °• Complete and partial dentures [phone] °[place], [place] Patients must complete an application for services.  There is often a waiting list.   ° °

## 2016-03-09 ENCOUNTER — Encounter (HOSPITAL_COMMUNITY): Payer: Self-pay | Admitting: *Deleted

## 2016-03-09 ENCOUNTER — Emergency Department (HOSPITAL_COMMUNITY)
Admission: EM | Admit: 2016-03-09 | Discharge: 2016-03-09 | Disposition: A | Discharge status: Home / Self Care | Payer: BLUE CROSS/BLUE SHIELD | Attending: Emergency Medicine | Admitting: Emergency Medicine

## 2016-03-09 DIAGNOSIS — F17210 Nicotine dependence, cigarettes, uncomplicated: Secondary | ICD-10-CM | POA: Diagnosis not present

## 2016-03-09 DIAGNOSIS — K0889 Other specified disorders of teeth and supporting structures: Secondary | ICD-10-CM | POA: Diagnosis present

## 2016-03-09 DIAGNOSIS — Z79899 Other long term (current) drug therapy: Secondary | ICD-10-CM | POA: Diagnosis not present

## 2016-03-09 DIAGNOSIS — K029 Dental caries, unspecified: Secondary | ICD-10-CM | POA: Insufficient documentation

## 2016-03-09 DIAGNOSIS — K047 Periapical abscess without sinus: Secondary | ICD-10-CM

## 2016-03-09 MED ORDER — OXYCODONE-ACETAMINOPHEN 5-325 MG PO TABS
1.0000 | ORAL_TABLET | Freq: Once | ORAL | Status: AC
  Administered 2016-03-09: 1 via ORAL
  Filled 2016-03-09: qty 1

## 2016-03-09 MED ORDER — AMOXICILLIN-POT CLAVULANATE 875-125 MG PO TABS: 1.0000 | ORAL_TABLET | Freq: Two times a day (BID) | ORAL | 0 refills | Status: DC

## 2016-03-09 MED ORDER — HYDROCODONE-ACETAMINOPHEN 5-325 MG PO TABS: 2.0000 | ORAL_TABLET | ORAL | 0 refills | Status: DC | PRN

## 2016-03-09 MED ORDER — IBUPROFEN 800 MG PO TABS: 800.0000 mg | ORAL_TABLET | Freq: Three times a day (TID) | ORAL | 0 refills | Status: DC

## 2016-03-09 MED ORDER — AMOXICILLIN-POT CLAVULANATE 875-125 MG PO TABS
1.0000 | ORAL_TABLET | Freq: Once | ORAL | Status: AC
  Administered 2016-03-09: 1 via ORAL
  Filled 2016-03-09: qty 1

## 2016-03-09 MED ORDER — ONDANSETRON 4 MG PO TBDP: 4.0000 mg | ORAL_TABLET | Freq: Three times a day (TID) | ORAL | 0 refills | Status: AC | PRN

## 2016-03-09 NOTE — ED Provider Notes (Signed)
[facility]-EMERGENCY DEPT Provider Note   CSN: [id number] Arrival date & time: 03/09/16  4098     History   Chief Complaint Chief Complaint  Patient presents with  . Dental Pain    HPI Malik Hernandez is a 40 y.o. male.  HPI   Patient is a 40 year old male presents emergency Department with complaint of right upper dental pain with associated swelling in his cheek which began over the last day. His pain is rated 6 out of 10 and associated with a temporal headache on the right side. He denies any fever, sweats, chills, trismus, difficulty speaking, swallowing or breathing.  He has multiple broken molars and diffuse dental caries.  One week ago he took 3 penicillins for similar dental pain but it subsided. He does have an appointment with a dentist in 2 days. He has taken ibuprofen with mild relief.  No other associated symptoms including no nausea, vomiting, neck pain or stiffness.    History reviewed. No pertinent past medical history.  There are no active problems to display for this patient.   Past Surgical History:  Procedure Laterality Date  . CERVICAL FUSION         Home Medications    Prior to Admission medications   Medication Sig Start Date End Date Taking? Authorizing Provider  amoxicillin-clavulanate (AUGMENTIN) 875-125 MG tablet Take 1 tablet by mouth every 12 (twelve) hours. 03/09/16   Danelle Berry, PA-C  cyclobenzaprine (FLEXERIL) 10 MG tablet Take 1 tablet (10 mg total) by mouth 3 (three) times daily as needed for muscle spasms. Patient not taking: Reported on 09/19/2014 11/12/13   Paula Libra, MD  HYDROcodone-acetaminophen (NORCO/VICODIN) 5-325 MG per tablet Take 1 tablet by mouth every 4 (four) hours as needed for moderate pain. 09/19/14   Azalia Bilis, MD  HYDROcodone-acetaminophen (NORCO/VICODIN) 5-325 MG tablet Take 2 tablets by mouth every 4 (four) hours as needed. 03/09/16   Danelle Berry, PA-C  ibuprofen (ADVIL,MOTRIN) 600 MG tablet Take 1 tablet (600 mg total) by  mouth every 8 (eight) hours as needed. 09/19/14   Azalia Bilis, MD  ibuprofen (ADVIL,MOTRIN) 800 MG tablet Take 1 tablet (800 mg total) by mouth 3 (three) times daily. 03/09/16   Danelle Berry, PA-C  naproxen (NAPROSYN) 500 MG tablet Take 1 tablet (500 mg total) by mouth 2 (two) times daily. 12/19/15   Ace Gins Sam, PA-C  ondansetron (ZOFRAN ODT) 4 MG disintegrating tablet Take 1 tablet (4 mg total) by mouth every 8 (eight) hours as needed for nausea or vomiting. 03/09/16   Danelle Berry, PA-C  oxyCODONE-acetaminophen (PERCOCET/ROXICET) 5-325 MG per tablet Take 2 tablets by mouth every 4 (four) hours as needed for severe pain. 02/05/15   Catha Gosselin, PA-C    Family History No family history on file.  Social History Social History  Substance Use Topics  . Smoking status: Current Some Day Smoker    Types: Cigars  . Smokeless tobacco: Never Used  . Alcohol use Yes     Comment: social     Allergies   Review of patient's allergies indicates no known allergies.   Review of Systems Review of Systems  All other systems reviewed and are negative.    Physical Exam Updated Vital Signs BP 122/75 (BP Location: Right Arm)   Pulse 64   Temp 98.8 F (37.1 C) (Oral)   Resp 14   Ht 5\' 10"  (1.778 m)   Wt 87.5 kg   SpO2 98%   BMI 27.69 kg/m   Physical  Exam  Constitutional: He is oriented to person, place, and time. He appears well-developed and well-nourished. No distress.  HENT:  Head: Normocephalic and atraumatic.  Right Ear: External ear normal.  Left Ear: External ear normal.  Nose: Nose normal.  Mouth/Throat: Uvula is midline, oropharynx is clear and moist and mucous membranes are normal. No oral lesions. No trismus in the jaw. Dental caries present. No dental abscesses or uvula swelling. No oropharyngeal exudate, posterior oropharyngeal edema, posterior oropharyngeal erythema or tonsillar abscesses.    No sublingual tenderness TTP to right upper molars and aveolar and buccal  gingiva with edema, no fluctuance Right cheek swelling, w/o erythema   Eyes: Conjunctivae and EOM are normal. Pupils are equal, round, and reactive to light. Right eye exhibits no discharge. Left eye exhibits no discharge. No scleral icterus.  Neck: Normal range of motion. Neck supple. No JVD present. No tracheal deviation present.  Cardiovascular: Normal rate and regular rhythm.   Pulmonary/Chest: Effort normal and breath sounds normal. No stridor. No respiratory distress.  Musculoskeletal: Normal range of motion. He exhibits no edema.  Lymphadenopathy:    He has no cervical adenopathy.  Neurological: He is alert and oriented to person, place, and time. He exhibits normal muscle tone. Coordination normal.  Skin: Skin is warm and dry. No rash noted. He is not diaphoretic. No erythema. No pallor.  Psychiatric: He has a normal mood and affect. His behavior is normal. Judgment and thought content normal.  Nursing note and vitals reviewed.    ED Treatments / Results  Labs (all labs ordered are listed, but only abnormal results are displayed) Labs Reviewed - No data to display  EKG  EKG Interpretation None       Radiology No results found.  Procedures Procedures (including critical care time)  Medications Ordered in ED Medications  amoxicillin-clavulanate (AUGMENTIN) 875-125 MG per tablet 1 tablet (1 tablet Oral Given 03/09/16 0857)  oxyCODONE-acetaminophen (PERCOCET/ROXICET) 5-325 MG per tablet 1 tablet (1 tablet Oral Given 03/09/16 0857)     Initial Impression / Assessment and Plan / ED Course  I have reviewed the triage vital signs and the nursing notes.  Pertinent labs & imaging results that were available during my care of the patient were reviewed by me and considered in my medical decision making (see chart for details).  Clinical Course   Patient with dental pain with associated right cheek and gum swelling and tenderness.  No gross abscess identified on exam, but  concern for spreading infection into right cheek. No sublingual tenderness, no trismus, no concern for Ludwigs Angina. Unclear where he has gotten penicillin from or why he has only had 3 pills.  Will give Augmentin and pain medicine.  Return precautions reviewed with the patient verbalizes understanding. Feel he is safe to discharge home since he has close follow-up in 2 days from now. He was instructed to return to the ER with any difficulty breathing, trismus, nausea or vomiting which prevents him from taking Abx.  He agrees to plan.   Final Clinical Impressions(s) / ED Diagnoses   Final diagnoses:  Dental infection    New Prescriptions Discharge Medication List as of 03/09/2016  8:52 AM    START taking these medications   Details  amoxicillin-clavulanate (AUGMENTIN) 875-125 MG tablet Take 1 tablet by mouth every 12 (twelve) hours., Starting Sat 03/09/2016, Print    !! HYDROcodone-acetaminophen (NORCO/VICODIN) 5-325 MG tablet Take 2 tablets by mouth every 4 (four) hours as needed., Starting Sat 03/09/2016, Print    !!  ibuprofen (ADVIL,MOTRIN) 800 MG tablet Take 1 tablet (800 mg total) by mouth 3 (three) times daily., Starting Sat 03/09/2016, Print    ondansetron (ZOFRAN ODT) 4 MG disintegrating tablet Take 1 tablet (4 mg total) by mouth every 8 (eight) hours as needed for nausea or vomiting., Starting Sat 03/09/2016, Print     !! - Potential duplicate medications found. Please discuss with provider.       Danelle BerryLeisa Jessaca Philippi, PA-C 03/12/16 Rickey [id number]    Loren Raceravid Yelverton, MD 03/13/16 [phone]

## 2016-03-09 NOTE — ED Notes (Signed)
Declined W/C at D/C and was escorted to lobby by RN. 
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
## 2016-06-05 ENCOUNTER — Encounter (HOSPITAL_COMMUNITY)
Admission: EM | Disposition: A | Discharge status: Home / Self Care | Payer: Self-pay | Source: Home / Self Care | Attending: Neurological Surgery

## 2016-06-05 ENCOUNTER — Emergency Department (HOSPITAL_COMMUNITY): Payer: BLUE CROSS/BLUE SHIELD

## 2016-06-05 ENCOUNTER — Inpatient Hospital Stay (HOSPITAL_COMMUNITY): Payer: BLUE CROSS/BLUE SHIELD | Admitting: Anesthesiology

## 2016-06-05 ENCOUNTER — Encounter (HOSPITAL_COMMUNITY): Payer: Self-pay | Admitting: Emergency Medicine

## 2016-06-05 ENCOUNTER — Inpatient Hospital Stay (HOSPITAL_COMMUNITY)
Admission: EM | Admit: 2016-06-05 | Discharge: 2016-06-06 | DRG: 473 | Disposition: A | Discharge status: Home / Self Care | Payer: BLUE CROSS/BLUE SHIELD | Attending: Neurological Surgery | Admitting: Neurological Surgery

## 2016-06-05 ENCOUNTER — Inpatient Hospital Stay (HOSPITAL_COMMUNITY): Payer: BLUE CROSS/BLUE SHIELD

## 2016-06-05 DIAGNOSIS — M79622 Pain in left upper arm: Secondary | ICD-10-CM | POA: Diagnosis present

## 2016-06-05 DIAGNOSIS — F17200 Nicotine dependence, unspecified, uncomplicated: Secondary | ICD-10-CM | POA: Diagnosis present

## 2016-06-05 DIAGNOSIS — S12600A Unspecified displaced fracture of seventh cervical vertebra, initial encounter for closed fracture: Secondary | ICD-10-CM | POA: Diagnosis present

## 2016-06-05 DIAGNOSIS — M5412 Radiculopathy, cervical region: Secondary | ICD-10-CM | POA: Diagnosis present

## 2016-06-05 DIAGNOSIS — M542 Cervicalgia: Secondary | ICD-10-CM | POA: Diagnosis present

## 2016-06-05 DIAGNOSIS — Y92410 Unspecified street and highway as the place of occurrence of the external cause: Secondary | ICD-10-CM

## 2016-06-05 DIAGNOSIS — S129XXA Fracture of neck, unspecified, initial encounter: Secondary | ICD-10-CM | POA: Diagnosis present

## 2016-06-05 DIAGNOSIS — Z419 Encounter for procedure for purposes other than remedying health state, unspecified: Secondary | ICD-10-CM

## 2016-06-05 HISTORY — PX: POSTERIOR CERVICAL FUSION/FORAMINOTOMY: SHX5038

## 2016-06-05 LAB — COMPREHENSIVE METABOLIC PANEL
ALK PHOS: 62 U/L (ref 38–126)
ALT: 21 U/L (ref 17–63)
ANION GAP: 10 (ref 5–15)
AST: 36 U/L (ref 15–41)
Albumin: 3.9 g/dL (ref 3.5–5.0)
BILIRUBIN TOTAL: 1.1 mg/dL (ref 0.3–1.2)
BUN: 10 mg/dL (ref 6–20)
CALCIUM: 8.8 mg/dL — AB (ref 8.9–10.3)
CO2: 24 mmol/L (ref 22–32)
CREATININE: 1 mg/dL (ref 0.61–1.24)
Chloride: 104 mmol/L (ref 101–111)
Glucose, Bld: 109 mg/dL — ABNORMAL HIGH (ref 65–99)
Potassium: 4 mmol/L (ref 3.5–5.1)
SODIUM: 138 mmol/L (ref 135–145)
TOTAL PROTEIN: 6.9 g/dL (ref 6.5–8.1)

## 2016-06-05 LAB — CBC
HCT: 41.4 % (ref 39.0–52.0)
Hemoglobin: 14.5 g/dL (ref 13.0–17.0)
MCH: 30.9 pg (ref 26.0–34.0)
MCHC: 35 g/dL (ref 30.0–36.0)
MCV: 88.1 fL (ref 78.0–100.0)
PLATELETS: 165 10*3/uL (ref 150–400)
RBC: 4.7 MIL/uL (ref 4.22–5.81)
RDW: 13.6 % (ref 11.5–15.5)
WBC: 13.5 10*3/uL — ABNORMAL HIGH (ref 4.0–10.5)

## 2016-06-05 LAB — SAMPLE TO BLOOD BANK

## 2016-06-05 LAB — MRSA PCR SCREENING: MRSA by PCR: NEGATIVE

## 2016-06-05 LAB — GLUCOSE, CAPILLARY: Glucose-Capillary: 105 mg/dL — ABNORMAL HIGH (ref 65–99)

## 2016-06-05 LAB — TYPE AND SCREEN
ABO/RH(D): O POS
ANTIBODY SCREEN: NEGATIVE

## 2016-06-05 LAB — I-STAT CHEM 8, ED
BUN: 12 mg/dL (ref 6–20)
CALCIUM ION: 1.11 mmol/L — AB (ref 1.15–1.40)
CHLORIDE: 102 mmol/L (ref 101–111)
CREATININE: 0.9 mg/dL (ref 0.61–1.24)
GLUCOSE: 103 mg/dL — AB (ref 65–99)
HCT: 43 % (ref 39.0–52.0)
Hemoglobin: 14.6 g/dL (ref 13.0–17.0)
POTASSIUM: 4 mmol/L (ref 3.5–5.1)
Sodium: 140 mmol/L (ref 135–145)
TCO2: 26 mmol/L (ref 0–100)

## 2016-06-05 LAB — ABO/RH: ABO/RH(D): O POS

## 2016-06-05 LAB — ETHANOL: Alcohol, Ethyl (B): <5 mg/dL (ref <5)

## 2016-06-05 LAB — PROTIME-INR
INR: 1.09
PROTHROMBIN TIME: 14.2 s (ref 11.4–15.2)

## 2016-06-05 LAB — I-STAT CG4 LACTIC ACID, ED: LACTIC ACID, VENOUS: 1.7 mmol/L (ref 0.5–1.9)

## 2016-06-05 LAB — CDS SEROLOGY

## 2016-06-05 SURGERY — POSTERIOR CERVICAL FUSION/FORAMINOTOMY LEVEL 3
Anesthesia: General | Site: Spine Cervical

## 2016-06-05 MED ORDER — DEXAMETHASONE SODIUM PHOSPHATE 10 MG/ML IJ SOLN
INTRAMUSCULAR | Status: AC
  Filled 2016-06-05: qty 1

## 2016-06-05 MED ORDER — ROCURONIUM BROMIDE 10 MG/ML (PF) SYRINGE
PREFILLED_SYRINGE | INTRAVENOUS | Status: AC
  Filled 2016-06-05: qty 10

## 2016-06-05 MED ORDER — SODIUM CHLORIDE 0.9 % IR SOLN
Status: DC | PRN
  Administered 2016-06-05: 20:00:00

## 2016-06-05 MED ORDER — PHENYLEPHRINE 40 MCG/ML (10ML) SYRINGE FOR IV PUSH (FOR BLOOD PRESSURE SUPPORT)
PREFILLED_SYRINGE | INTRAVENOUS | Status: AC
  Filled 2016-06-05: qty 10

## 2016-06-05 MED ORDER — SUCCINYLCHOLINE CHLORIDE 200 MG/10ML IV SOSY
PREFILLED_SYRINGE | INTRAVENOUS | Status: DC | PRN
  Administered 2016-06-05: 140 mg via INTRAVENOUS

## 2016-06-05 MED ORDER — CEFAZOLIN SODIUM-DEXTROSE 2-4 GM/100ML-% IV SOLN
2.0000 g | INTRAVENOUS | Status: AC
  Administered 2016-06-05: 2 g via INTRAVENOUS

## 2016-06-05 MED ORDER — ROCURONIUM BROMIDE 100 MG/10ML IV SOLN
INTRAVENOUS | Status: DC | PRN
  Administered 2016-06-05 (×3): 20 mg via INTRAVENOUS
  Administered 2016-06-05: 10 mg via INTRAVENOUS
  Administered 2016-06-05: 70 mg via INTRAVENOUS

## 2016-06-05 MED ORDER — CELECOXIB 200 MG PO CAPS
200.0000 mg | ORAL_CAPSULE | Freq: Once | ORAL | Status: AC
  Administered 2016-06-05: 200 mg via ORAL

## 2016-06-05 MED ORDER — GABAPENTIN 300 MG PO CAPS: 300.0000 mg | ORAL_CAPSULE | Freq: Once | ORAL | Status: DC

## 2016-06-05 MED ORDER — FLEET ENEMA 7-19 GM/118ML RE ENEM: 1.0000 | ENEMA | Freq: Once | RECTAL | Status: DC | PRN

## 2016-06-05 MED ORDER — MIDAZOLAM HCL 2 MG/2ML IJ SOLN
INTRAMUSCULAR | Status: AC
  Filled 2016-06-05: qty 2

## 2016-06-05 MED ORDER — SUGAMMADEX SODIUM 200 MG/2ML IV SOLN
INTRAVENOUS | Status: DC | PRN
  Administered 2016-06-05: 200 mg via INTRAVENOUS

## 2016-06-05 MED ORDER — DOCUSATE SODIUM 100 MG PO CAPS
100.0000 mg | ORAL_CAPSULE | Freq: Two times a day (BID) | ORAL | Status: DC
  Administered 2016-06-06: 100 mg via ORAL
  Filled 2016-06-05: qty 1

## 2016-06-05 MED ORDER — LIDOCAINE 2% (20 MG/ML) 5 ML SYRINGE
INTRAMUSCULAR | Status: AC
  Filled 2016-06-05: qty 5

## 2016-06-05 MED ORDER — ONDANSETRON HCL 4 MG/2ML IJ SOLN
INTRAMUSCULAR | Status: AC
  Filled 2016-06-05: qty 2

## 2016-06-05 MED ORDER — PHENOL 1.4 % MT LIQD
1.0000 | OROMUCOSAL | Status: DC | PRN
Start: 2016-06-05 — End: 2016-06-06

## 2016-06-05 MED ORDER — MENTHOL 3 MG MT LOZG: 1.0000 | LOZENGE | OROMUCOSAL | Status: DC | PRN

## 2016-06-05 MED ORDER — SUGAMMADEX SODIUM 200 MG/2ML IV SOLN
INTRAVENOUS | Status: AC
  Filled 2016-06-05: qty 2

## 2016-06-05 MED ORDER — LACTATED RINGERS IV SOLN
INTRAVENOUS | Status: DC
  Administered 2016-06-05 (×2): via INTRAVENOUS

## 2016-06-05 MED ORDER — HYDROCODONE-ACETAMINOPHEN 7.5-325 MG PO TABS: 1.0000 | ORAL_TABLET | Freq: Once | ORAL | Status: DC | PRN

## 2016-06-05 MED ORDER — SUCCINYLCHOLINE CHLORIDE 200 MG/10ML IV SOSY
PREFILLED_SYRINGE | INTRAVENOUS | Status: AC
  Filled 2016-06-05: qty 10

## 2016-06-05 MED ORDER — BACITRACIN ZINC 500 UNIT/GM EX OINT
TOPICAL_OINTMENT | CUTANEOUS | Status: DC | PRN
  Administered 2016-06-05: 1 via TOPICAL

## 2016-06-05 MED ORDER — BACITRACIN ZINC 500 UNIT/GM EX OINT
TOPICAL_OINTMENT | CUTANEOUS | Status: AC
  Filled 2016-06-05: qty 28.35

## 2016-06-05 MED ORDER — ALBUMIN HUMAN 5 % IV SOLN
INTRAVENOUS | Status: DC | PRN
  Administered 2016-06-05 (×2): via INTRAVENOUS

## 2016-06-05 MED ORDER — SENNA 8.6 MG PO TABS
1.0000 | ORAL_TABLET | Freq: Two times a day (BID) | ORAL | Status: DC
  Administered 2016-06-06: 8.6 mg via ORAL
  Filled 2016-06-05: qty 1

## 2016-06-05 MED ORDER — FENTANYL CITRATE (PF) 100 MCG/2ML IJ SOLN
INTRAMUSCULAR | Status: DC | PRN
  Administered 2016-06-05 (×2): 100 ug via INTRAVENOUS

## 2016-06-05 MED ORDER — BUPIVACAINE LIPOSOME 1.3 % IJ SUSP
20.0000 mL | Freq: Once | INTRAMUSCULAR | Status: DC
  Filled 2016-06-05: qty 20

## 2016-06-05 MED ORDER — FENTANYL CITRATE (PF) 100 MCG/2ML IJ SOLN
INTRAMUSCULAR | Status: AC
  Filled 2016-06-05: qty 2

## 2016-06-05 MED ORDER — SODIUM CHLORIDE 0.9% FLUSH: 3.0000 mL | INTRAVENOUS | Status: DC | PRN

## 2016-06-05 MED ORDER — ACETAMINOPHEN 10 MG/ML IV SOLN
1000.0000 mg | Freq: Once | INTRAVENOUS | Status: AC
  Administered 2016-06-05: 1000 mg via INTRAVENOUS

## 2016-06-05 MED ORDER — PANTOPRAZOLE SODIUM 40 MG IV SOLR
40.0000 mg | Freq: Every day | INTRAVENOUS | Status: DC
  Filled 2016-06-05: qty 40

## 2016-06-05 MED ORDER — ACETAMINOPHEN 500 MG PO TABS
1000.0000 mg | ORAL_TABLET | Freq: Four times a day (QID) | ORAL | Status: DC
  Administered 2016-06-06 (×2): 500 mg via ORAL
  Administered 2016-06-06: 1000 mg via ORAL
  Filled 2016-06-05 (×3): qty 2

## 2016-06-05 MED ORDER — BUPIVACAINE-EPINEPHRINE 0.5% -1:200000 IJ SOLN
INTRAMUSCULAR | Status: DC | PRN
  Administered 2016-06-05: 15 mL

## 2016-06-05 MED ORDER — BUPIVACAINE LIPOSOME 1.3 % IJ SUSP
INTRAMUSCULAR | Status: DC | PRN
  Administered 2016-06-05: 20 mL

## 2016-06-05 MED ORDER — MIDAZOLAM HCL 2 MG/2ML IJ SOLN
INTRAMUSCULAR | Status: DC | PRN
  Administered 2016-06-05: 2 mg via INTRAVENOUS

## 2016-06-05 MED ORDER — LIDOCAINE-EPINEPHRINE 1 %-1:100000 IJ SOLN
INTRAMUSCULAR | Status: AC
  Filled 2016-06-05: qty 1

## 2016-06-05 MED ORDER — LIDOCAINE 2% (20 MG/ML) 5 ML SYRINGE
INTRAMUSCULAR | Status: DC | PRN
  Administered 2016-06-05: 50 mg via INTRAVENOUS

## 2016-06-05 MED ORDER — SODIUM CHLORIDE 0.9 % IV SOLN
INTRAVENOUS | Status: DC
  Administered 2016-06-06: 02:00:00 via INTRAVENOUS

## 2016-06-05 MED ORDER — MORPHINE SULFATE (PF) 4 MG/ML IV SOLN
4.0000 mg | Freq: Once | INTRAVENOUS | Status: AC
  Administered 2016-06-05: 4 mg via INTRAVENOUS
  Filled 2016-06-05: qty 1

## 2016-06-05 MED ORDER — GABAPENTIN 300 MG PO CAPS
300.0000 mg | ORAL_CAPSULE | Freq: Three times a day (TID) | ORAL | Status: DC
  Administered 2016-06-06: 300 mg via ORAL
  Filled 2016-06-05: qty 1

## 2016-06-05 MED ORDER — THROMBIN 20000 UNITS EX SOLR
CUTANEOUS | Status: AC
  Filled 2016-06-05: qty 20000

## 2016-06-05 MED ORDER — THROMBIN 5000 UNITS EX SOLR
CUTANEOUS | Status: AC
  Filled 2016-06-05: qty 5000

## 2016-06-05 MED ORDER — ONDANSETRON HCL 4 MG/2ML IJ SOLN: 4.0000 mg | Freq: Once | INTRAMUSCULAR | Status: DC | PRN

## 2016-06-05 MED ORDER — HYDROMORPHONE HCL 1 MG/ML IJ SOLN
1.0000 mg | Freq: Once | INTRAMUSCULAR | Status: AC
  Administered 2016-06-05: 1 mg via INTRAVENOUS
  Filled 2016-06-05: qty 1

## 2016-06-05 MED ORDER — CEFAZOLIN IN D5W 1 GM/50ML IV SOLN
1.0000 g | Freq: Three times a day (TID) | INTRAVENOUS | Status: AC
  Administered 2016-06-06 (×2): 1 g via INTRAVENOUS
  Filled 2016-06-05 (×2): qty 50

## 2016-06-05 MED ORDER — CEFAZOLIN SODIUM-DEXTROSE 2-4 GM/100ML-% IV SOLN
INTRAVENOUS | Status: AC
  Filled 2016-06-05: qty 100

## 2016-06-05 MED ORDER — PROPOFOL 10 MG/ML IV BOLUS
INTRAVENOUS | Status: DC | PRN
  Administered 2016-06-05: 200 mg via INTRAVENOUS

## 2016-06-05 MED ORDER — ONDANSETRON HCL 4 MG/2ML IJ SOLN: 4.0000 mg | INTRAMUSCULAR | Status: DC | PRN

## 2016-06-05 MED ORDER — HYDROMORPHONE HCL 1 MG/ML IJ SOLN
INTRAMUSCULAR | Status: AC
  Filled 2016-06-05: qty 0.5

## 2016-06-05 MED ORDER — LACTATED RINGERS IV SOLN
INTRAVENOUS | Status: DC | PRN
  Administered 2016-06-05 (×2): via INTRAVENOUS

## 2016-06-05 MED ORDER — THROMBIN 20000 UNITS EX SOLR
CUTANEOUS | Status: DC | PRN
  Administered 2016-06-05: 20:00:00 via TOPICAL

## 2016-06-05 MED ORDER — ACETAMINOPHEN 10 MG/ML IV SOLN
INTRAVENOUS | Status: AC
  Administered 2016-06-05: 1000 mg via INTRAVENOUS
  Filled 2016-06-05: qty 100

## 2016-06-05 MED ORDER — ZOLPIDEM TARTRATE 5 MG PO TABS: 5.0000 mg | ORAL_TABLET | Freq: Every evening | ORAL | Status: DC | PRN

## 2016-06-05 MED ORDER — BISACODYL 5 MG PO TBEC: 5.0000 mg | DELAYED_RELEASE_TABLET | Freq: Every day | ORAL | Status: DC | PRN

## 2016-06-05 MED ORDER — THROMBIN 5000 UNITS EX SOLR
OROMUCOSAL | Status: DC | PRN
  Administered 2016-06-05: 20:00:00 via TOPICAL

## 2016-06-05 MED ORDER — HYDROMORPHONE HCL 1 MG/ML IJ SOLN
0.2500 mg | INTRAMUSCULAR | Status: DC | PRN
  Administered 2016-06-05: 0.5 mg via INTRAVENOUS

## 2016-06-05 MED ORDER — CELECOXIB 200 MG PO CAPS
200.0000 mg | ORAL_CAPSULE | Freq: Two times a day (BID) | ORAL | Status: DC
  Administered 2016-06-06: 200 mg via ORAL
  Filled 2016-06-05: qty 1

## 2016-06-05 MED ORDER — ONDANSETRON HCL 4 MG/2ML IJ SOLN
INTRAMUSCULAR | Status: DC | PRN
  Administered 2016-06-05: 4 mg via INTRAVENOUS

## 2016-06-05 MED ORDER — LIDOCAINE-EPINEPHRINE 2 %-1:100000 IJ SOLN
INTRAMUSCULAR | Status: DC | PRN
  Administered 2016-06-05: 15 mL

## 2016-06-05 MED ORDER — DEXAMETHASONE SODIUM PHOSPHATE 10 MG/ML IJ SOLN
INTRAMUSCULAR | Status: DC | PRN
  Administered 2016-06-05: 5 mg via INTRAVENOUS

## 2016-06-05 MED ORDER — METHOCARBAMOL 750 MG PO TABS
750.0000 mg | ORAL_TABLET | Freq: Four times a day (QID) | ORAL | Status: DC
  Administered 2016-06-06 (×3): 750 mg via ORAL
  Filled 2016-06-05 (×4): qty 1

## 2016-06-05 MED ORDER — CELECOXIB 200 MG PO CAPS
ORAL_CAPSULE | ORAL | Status: AC
  Administered 2016-06-05: 200 mg via ORAL
  Filled 2016-06-05: qty 2

## 2016-06-05 MED ORDER — GABAPENTIN 300 MG PO CAPS
ORAL_CAPSULE | ORAL | Status: AC
  Filled 2016-06-05: qty 1

## 2016-06-05 MED ORDER — ACETAMINOPHEN 10 MG/ML IV SOLN
INTRAVENOUS | Status: DC | PRN
  Administered 2016-06-05: 1000 mg via INTRAVENOUS

## 2016-06-05 MED ORDER — VANCOMYCIN HCL 1000 MG IV SOLR
INTRAVENOUS | Status: DC | PRN
  Administered 2016-06-05: 1000 mg via TOPICAL

## 2016-06-05 MED ORDER — ARTIFICIAL TEARS OP OINT
TOPICAL_OINTMENT | OPHTHALMIC | Status: DC | PRN
  Administered 2016-06-05: 1 via OPHTHALMIC

## 2016-06-05 MED ORDER — SODIUM CHLORIDE 0.9% FLUSH
3.0000 mL | Freq: Two times a day (BID) | INTRAVENOUS | Status: DC
  Administered 2016-06-05 – 2016-06-06 (×2): 3 mL via INTRAVENOUS

## 2016-06-05 MED ORDER — PROPOFOL 10 MG/ML IV BOLUS
INTRAVENOUS | Status: AC
  Filled 2016-06-05: qty 20

## 2016-06-05 MED ORDER — OXYCODONE HCL ER 20 MG PO T12A
20.0000 mg | EXTENDED_RELEASE_TABLET | Freq: Two times a day (BID) | ORAL | Status: DC
  Administered 2016-06-06 (×2): 20 mg via ORAL
  Filled 2016-06-05 (×3): qty 1

## 2016-06-05 MED ORDER — VANCOMYCIN HCL 1000 MG IV SOLR
INTRAVENOUS | Status: AC
  Filled 2016-06-05: qty 1000

## 2016-06-05 MED ORDER — PHENYLEPHRINE HCL 10 MG/ML IJ SOLN
INTRAVENOUS | Status: DC | PRN
  Administered 2016-06-05: 15 ug/min via INTRAVENOUS

## 2016-06-05 MED ORDER — OXYCODONE HCL 5 MG PO TABS
5.0000 mg | ORAL_TABLET | ORAL | Status: DC | PRN
  Administered 2016-06-06: 10 mg via ORAL
  Filled 2016-06-05 (×2): qty 2

## 2016-06-05 MED ORDER — SODIUM CHLORIDE 0.9 % IV SOLN: 250.0000 mL | INTRAVENOUS | Status: DC

## 2016-06-05 SURGICAL SUPPLY — 91 items
ADH SKN CLS APL DERMABOND .7 (GAUZE/BANDAGES/DRESSINGS) ×1
APL SKNCLS STERI-STRIP NONHPOA (GAUZE/BANDAGES/DRESSINGS) ×1
BENZOIN TINCTURE PRP APPL 2/3 (GAUZE/BANDAGES/DRESSINGS) ×2 IMPLANT
BIT DRILL VUEPOINT II (BIT) IMPLANT
BLADE CLIPPER SURG (BLADE) IMPLANT
BLADE ULTRA TIP 2M (BLADE) IMPLANT
BUR MATCHSTICK NEURO 3.0 LAGG (BURR) ×2 IMPLANT
BUR PRECISION FLUTE 5.0 (BURR) ×2 IMPLANT
CANISTER SUCT 3000ML PPV (MISCELLANEOUS) ×3 IMPLANT
CHLORAPREP W/TINT 26ML (MISCELLANEOUS) ×3 IMPLANT
CLOSURE WOUND 1/2 X4 (GAUZE/BANDAGES/DRESSINGS) ×1
COVER BACK TABLE 60X90IN (DRAPES) ×3 IMPLANT
DECANTER SPIKE VIAL GLASS SM (MISCELLANEOUS) ×3 IMPLANT
DERMABOND ADVANCED (GAUZE/BANDAGES/DRESSINGS) ×2
DERMABOND ADVANCED .7 DNX12 (GAUZE/BANDAGES/DRESSINGS) IMPLANT
DRAPE C-ARM 42X72 X-RAY (DRAPES) ×3 IMPLANT
DRAPE C-ARMOR (DRAPES) ×3 IMPLANT
DRAPE MICROSCOPE LEICA (MISCELLANEOUS) IMPLANT
DRAPE POUCH INSTRU U-SHP 10X18 (DRAPES) ×3 IMPLANT
DRILL BIT VUEPOINT II (BIT) ×3
DRSG OPSITE POSTOP 4X6 (GAUZE/BANDAGES/DRESSINGS) ×2 IMPLANT
DRSG PAD ABDOMINAL 8X10 ST (GAUZE/BANDAGES/DRESSINGS) IMPLANT
ELECT REM PT RETURN 9FT ADLT (ELECTROSURGICAL) ×3
ELECTRODE REM PT RTRN 9FT ADLT (ELECTROSURGICAL) ×1 IMPLANT
GAUZE SPONGE 4X4 12PLY STRL (GAUZE/BANDAGES/DRESSINGS) ×1 IMPLANT
GAUZE SPONGE 4X4 16PLY XRAY LF (GAUZE/BANDAGES/DRESSINGS) IMPLANT
GLOVE BIOGEL PI IND STRL 7.0 (GLOVE) IMPLANT
GLOVE BIOGEL PI IND STRL 7.5 (GLOVE) ×1 IMPLANT
GLOVE BIOGEL PI INDICATOR 7.0 (GLOVE) ×2
GLOVE BIOGEL PI INDICATOR 7.5 (GLOVE) ×4
GLOVE SS BIOGEL STRL SZ 7.5 (GLOVE) ×1 IMPLANT
GLOVE SS N UNI LF 6.5 STRL (GLOVE) ×2 IMPLANT
GLOVE SUPERSENSE BIOGEL SZ 7.5 (GLOVE) ×2
GOWN STRL REUS W/ TWL LRG LVL3 (GOWN DISPOSABLE) IMPLANT
GOWN STRL REUS W/ TWL XL LVL3 (GOWN DISPOSABLE) IMPLANT
GOWN STRL REUS W/TWL 2XL LVL3 (GOWN DISPOSABLE) IMPLANT
GOWN STRL REUS W/TWL LRG LVL3 (GOWN DISPOSABLE) ×9
GOWN STRL REUS W/TWL XL LVL3 (GOWN DISPOSABLE)
HEMOSTAT POWDER KIT SURGIFOAM (HEMOSTASIS) ×2 IMPLANT
HEMOSTAT POWDER SURGIFOAM 1G (HEMOSTASIS) ×3 IMPLANT
HEMOSTAT SURGICEL 2X14 (HEMOSTASIS) IMPLANT
KIT BASIN OR (CUSTOM PROCEDURE TRAY) ×3 IMPLANT
KIT INFUSE X SMALL 1.4CC (Orthopedic Implant) ×2 IMPLANT
KIT ROOM TURNOVER OR (KITS) ×3 IMPLANT
MARKER SKIN DUAL TIP RULER LAB (MISCELLANEOUS) ×3 IMPLANT
NDL HYPO 18GX1.5 BLUNT FILL (NEEDLE) IMPLANT
NDL HYPO 21X1.5 SAFETY (NEEDLE) ×1 IMPLANT
NDL HYPO 25X1 1.5 SAFETY (NEEDLE) ×1 IMPLANT
NDL SPNL 22GX3.5 QUINCKE BK (NEEDLE) ×1 IMPLANT
NEEDLE HYPO 18GX1.5 BLUNT FILL (NEEDLE) IMPLANT
NEEDLE HYPO 21X1.5 SAFETY (NEEDLE) ×6 IMPLANT
NEEDLE HYPO 25X1 1.5 SAFETY (NEEDLE) IMPLANT
NEEDLE SPNL 22GX3.5 QUINCKE BK (NEEDLE) ×3 IMPLANT
NS IRRIG 1000ML POUR BTL (IV SOLUTION) ×3 IMPLANT
PACK LAMINECTOMY NEURO (CUSTOM PROCEDURE TRAY) ×3 IMPLANT
PACK UNIVERSAL I (CUSTOM PROCEDURE TRAY) ×3 IMPLANT
PATTIES SURGICAL .5X1.5 (GAUZE/BANDAGES/DRESSINGS) IMPLANT
PIN MAYFIELD SKULL DISP (PIN) ×3 IMPLANT
PUTTY BONE ATTRAX 10CC STRIP (Putty) ×2 IMPLANT
ROD VUEPOINT 80MM (Rod) ×4 IMPLANT
RUBBERBAND STERILE (MISCELLANEOUS) IMPLANT
SCREW MA MM 3.5X14 (Screw) ×8 IMPLANT
SCREW SET THREADED (Screw) ×14 IMPLANT
SCREW VUEPOINT II 4.0X30MM MA (Screw) ×6 IMPLANT
SEALER BIPOLAR AQUA 2.3 (INSTRUMENTS) ×2 IMPLANT
SPONGE INTESTINAL PEANUT (DISPOSABLE) ×2 IMPLANT
SPONGE NEURO XRAY DETECT 1X3 (DISPOSABLE) ×1 IMPLANT
SPONGE SURGIFOAM ABS GEL 100 (HEMOSTASIS) ×2 IMPLANT
SPONGE SURGIFOAM ABS GEL SZ50 (HEMOSTASIS) ×1 IMPLANT
STAPLER VISISTAT 35W (STAPLE) ×3 IMPLANT
STRIP CLOSURE SKIN 1/2X4 (GAUZE/BANDAGES/DRESSINGS) ×1 IMPLANT
STRIP SURGICAL 1 X 6 IN (GAUZE/BANDAGES/DRESSINGS) IMPLANT
STRIP SURGICAL 1/2 X 6 IN (GAUZE/BANDAGES/DRESSINGS) IMPLANT
STRIP SURGICAL 1/4 X 6 IN (GAUZE/BANDAGES/DRESSINGS) IMPLANT
STRIP SURGICAL 3/4 X 6 IN (GAUZE/BANDAGES/DRESSINGS) IMPLANT
SUT STRATAFIX 1PDS 45CM VIOLET (SUTURE) ×2 IMPLANT
SUT STRATAFIX MNCRL+ 3-0 PS-2 (SUTURE) ×1
SUT STRATAFIX MONOCRYL 3-0 (SUTURE) ×2
SUT STRATAFIX SPIRAL + 2-0 (SUTURE) ×2 IMPLANT
SUT VIC AB 0 CT1 18XCR BRD8 (SUTURE) IMPLANT
SUT VIC AB 0 CT1 8-18 (SUTURE) ×3
SUT VIC AB 2-0 CT1 18 (SUTURE) IMPLANT
SUT VIC AB 3-0 SH 8-18 (SUTURE) ×2 IMPLANT
SUTURE STRATFX MNCRL+ 3-0 PS-2 (SUTURE) IMPLANT
SYR 30ML LL (SYRINGE) ×5 IMPLANT
SYR 3ML LL SCALE MARK (SYRINGE) IMPLANT
TOWEL OR 17X24 6PK STRL BLUE (TOWEL DISPOSABLE) ×3 IMPLANT
TOWEL OR 17X26 10 PK STRL BLUE (TOWEL DISPOSABLE) ×3 IMPLANT
TRAY FOLEY W/METER SILVER 16FR (SET/KITS/TRAYS/PACK) IMPLANT
UNDERPAD 30X30 (UNDERPADS AND DIAPERS) ×1 IMPLANT
WATER STERILE IRR 1000ML POUR (IV SOLUTION) ×3 IMPLANT

## 2016-06-05 NOTE — Progress Notes (Signed)
Patient arrived to unit alert and oriented x 4, very drowsy but arouaseable by voice, unable to stay awake to follow commands. Patient infusing NS in peripheral LAC. Cervical collar applied and in place. Family notified of patient arrival to unit. RN will continue to monitor.
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
## 2016-06-05 NOTE — Transfer of Care (Signed)
Immediate Anesthesia Transfer of Care Note  Patient: Malik Hernandez  Procedure(s) Performed: Procedure(s): LEFT CERVICAL SIX-SEVEN LAMINOTOMY, CERVICAL SIX-SEVEN DECOMPRESSIVE LAMINECTOMY, CERVICAL FIVE-SIX, CERVICAL SIX-SEVEN, CERVICAL SEVEN-THORACIC ONE DORSAL FUSION (N/A)  Patient Location: PACU  Anesthesia Type:General  Level of Consciousness: awake, sedated and patient cooperative  Airway & Oxygen Therapy: Patient connected to nasal cannula oxygen  Post-op Assessment: Report given to RN, Post -op Vital signs reviewed and stable and Patient moving all extremities X 4  Post vital signs: Reviewed and stable  Last Vitals:  Vitals:   06/05/16 1702 06/05/16 2135  BP: 117/73 119/76  Pulse: (!) 56 75  Resp: 17 12  Temp: 37.3 C 36.1 C    Last Pain:  Vitals:   06/05/16 2135  TempSrc:   PainSc: Asleep         Complications: No apparent anesthesia complications
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
## 2016-06-05 NOTE — Brief Op Note (Signed)
06/05/2016  9:25 PM  PATIENT:  Malik Hernandez  40 y.o. male  PRE-OPERATIVE DIAGNOSIS:  Cervical Fracture  POST-OPERATIVE DIAGNOSIS:  Cervical Fracture  PROCEDURE:  Procedure(s): CERVICAL SIX-SEVEN DECOMPRESSIVE LAMINECTOMY, CERVICAL FIVE-SIX, CERVICAL SIX-SEVEN, CERVICAL SEVEN-THORACIC ONE POSTERIOR CERVICAL FUSION (N/A)  SURGEON:  Surgeon(s) and Role:    * Loura HaltBenjamin Jared Sueanne Maniaci, MD - Primary  PHYSICIAN ASSISTANT:   ASSISTANTS: none   ANESTHESIA:   general  EBL:  Total I/O In: 2900 [I.V.:2400; IV Piggyback:500] Out: 525 [Other:25; Blood:500]  BLOOD ADMINISTERED:none  DRAINS: Medium hemovac  LOCAL MEDICATIONS USED:  MARCAINE    and LIDOCAINE   SPECIMEN:  No Specimen  DISPOSITION OF SPECIMEN:  N/A  COUNTS:  YES  TOURNIQUET:  * No tourniquets in log *  DICTATION: .Dragon Dictation  PLAN OF CARE: Admit to inpatient   PATIENT DISPOSITION:  PACU - hemodynamically stable.   Delay start of Pharmacological VTE agent (>24hrs) due to surgical blood loss or risk of bleeding: yes
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
## 2016-06-05 NOTE — ED Notes (Addendum)
TRANSPORTED TO CT SCAN/X-RAY .

## 2016-06-05 NOTE — ED Notes (Signed)
Patient transported to CT scan . 

## 2016-06-05 NOTE — Progress Notes (Signed)
Pt arrived on unit. HOB flat. Pt is alert and oriented to unit. Family at bedside. Call button within reach.

## 2016-06-05 NOTE — Progress Notes (Signed)
   06/05/16 0422  Clinical Encounter Type  Visited With Patient and family together  Visit Type ED  Referral From Other (Comment) (Page-level 2)  Spiritual Encounters  Spiritual Needs Emotional  Stress Factors  Patient Stress Factors Health changes  Family Stress Factors Family relationships  Escorted family to consult room B. Pt. Stabilized and family brought to D36.

## 2016-06-05 NOTE — ED Notes (Signed)
Aspen collar applied to pt

## 2016-06-05 NOTE — Anesthesia Procedure Notes (Signed)
Procedure Name: Intubation Date/Time: 06/05/2016 6:38 PM Performed by: Orvilla FusATO, Safiyyah Vasconez A Pre-anesthesia Checklist: Patient identified, Emergency Drugs available, Suction available and Patient being monitored Patient Re-evaluated:Patient Re-evaluated prior to inductionOxygen Delivery Method: Circle System Utilized Preoxygenation: Pre-oxygenation with 100% oxygen Intubation Type: IV induction and Rapid sequence Laryngoscope Size: Glidescope and 4 Grade View: Grade I Tube type: Oral Tube size: 7.5 mm Number of attempts: 1 Airway Equipment and Method: Rigid stylet and Video-laryngoscopy Placement Confirmation: ETT inserted through vocal cords under direct vision,  positive ETCO2 and breath sounds checked- equal and bilateral Secured at: 23 cm Tube secured with: Tape Dental Injury: Teeth and Oropharynx as per pre-operative assessment

## 2016-06-05 NOTE — ED Notes (Signed)
Returned from CT scan with no distress , Aspen collar intact , IV site unremarkable ( 18 g. Right AC) , family at bedside , VSWNL , waiting for admitting MD .

## 2016-06-06 LAB — BASIC METABOLIC PANEL
ANION GAP: 7 (ref 5–15)
BUN: 7 mg/dL (ref 6–20)
CO2: 27 mmol/L (ref 22–32)
Calcium: 8.8 mg/dL — ABNORMAL LOW (ref 8.9–10.3)
Chloride: 106 mmol/L (ref 101–111)
Creatinine, Ser: 1.02 mg/dL (ref 0.61–1.24)
GFR calc Af Amer: >60 mL/min (ref >60)
GLUCOSE: 149 mg/dL — AB (ref 65–99)
POTASSIUM: 3.8 mmol/L (ref 3.5–5.1)
Sodium: 140 mmol/L (ref 135–145)

## 2016-06-06 LAB — CBC
HEMATOCRIT: 38.8 % — AB (ref 39.0–52.0)
Hemoglobin: 13.3 g/dL (ref 13.0–17.0)
MCH: 30.4 pg (ref 26.0–34.0)
MCHC: 34.3 g/dL (ref 30.0–36.0)
MCV: 88.8 fL (ref 78.0–100.0)
PLATELETS: 180 10*3/uL (ref 150–400)
RBC: 4.37 MIL/uL (ref 4.22–5.81)
RDW: 13.8 % (ref 11.5–15.5)
WBC: 9.3 10*3/uL (ref 4.0–10.5)

## 2016-06-06 MED ORDER — PANTOPRAZOLE SODIUM 40 MG PO TBEC
40.0000 mg | DELAYED_RELEASE_TABLET | Freq: Every day | ORAL | Status: DC
  Administered 2016-06-06: 40 mg via ORAL
  Filled 2016-06-06: qty 1

## 2016-06-06 MED ORDER — METHOCARBAMOL 750 MG PO TABS: 750.0000 mg | ORAL_TABLET | Freq: Four times a day (QID) | ORAL | 2 refills | Status: DC

## 2016-06-06 MED ORDER — HYDROCODONE-ACETAMINOPHEN 7.5-325 MG PO TABS: 1.0000 | ORAL_TABLET | Freq: Four times a day (QID) | ORAL | 0 refills | Status: AC | PRN

## 2016-06-06 MED ORDER — GABAPENTIN 300 MG PO CAPS: 300.0000 mg | ORAL_CAPSULE | Freq: Three times a day (TID) | ORAL | 2 refills | Status: AC

## 2016-06-06 MED FILL — GABAPENTIN 300 MG CAPSULE: 300 | 30 days supply | Qty: 90 | Fill #0

## 2016-06-06 MED FILL — METHOCARBAMOL 750 MG TABLET: 750 | 30 days supply | Qty: 120 | Fill #0

## 2016-06-06 NOTE — Evaluation (Signed)
Physical Therapy Evaluation Patient Details Name: Malik Hernandez MRN: [id number] DOB: [birth year] Today's Date: 06/06/2016   History of Present Illness  s/p posterior cervical fusion/foraminotomy level 3  Clinical Impression  Patient evaluated by Physical Therapy with no further acute PT needs identified. All education has been completed and the patient has no further questions. At the time of PT eval pt was able to perform transfers and ambulation with gross modified independence. Pt and wife were educated on stair training and pt reports he feels comfortable performing at home. See below for any follow-up Physical Therapy or equipment needs. PT is signing off. Thank you for this referral.     Follow Up Recommendations Outpatient PT;Supervision - Intermittent    Equipment Recommendations  None recommended by PT    Recommendations for Other Services       Precautions / Restrictions Precautions Precautions: Cervical Precaution Comments: handout provided Required Braces or Orthoses: Cervical Brace Cervical Brace: Hard collar Restrictions Weight Bearing Restrictions: No      Mobility  Bed Mobility               General bed mobility comments: up in chair  Transfers Overall transfer level: Modified independent Equipment used: None Transfers: Sit to/from Stand           General transfer comment: From recliner. Pt with difficulty attempting to push up with LUE and was twisting to the side to achieve full stand. Pt was more successful with pushing up from the arm rest on the R side and from his knee on the L side.   Ambulation/Gait Ambulation/Gait assistance: Modified independent (Device/Increase time) Ambulation Distance (Feet): 300 Feet Assistive device: None Gait Pattern/deviations: Step-through pattern;Decreased stride length;Trunk flexed Gait velocity: Decreased Gait velocity interpretation: Below normal speed for age/gender General Gait Details: VC's for  improved posture and general safety.   Stairs            Wheelchair Mobility    Modified Rankin (Stroke Patients Only)       Balance Overall balance assessment: Needs assistance;No apparent balance deficits (not formally assessed) Sitting-balance support: Feet supported;No upper extremity supported Sitting balance-Leahy Scale: Good     Standing balance support: No upper extremity supported;During functional activity Standing balance-Leahy Scale: Good                               Pertinent Vitals/Pain Pain Assessment: 0-10 Pain Score: 3  Pain Location: Neck Pain Descriptors / Indicators: Tightness Pain Intervention(s): Limited activity within patient's tolerance;Monitored during session;Repositioned    Home Living Family/patient expects to be discharged to:: Private residence Living Arrangements: Spouse/significant other;Children (teenager) Available Help at Discharge: Family;Available 24 hours/day Type of Home: House Home Access: Stairs to enter Entrance Stairs-Rails: None Entrance Stairs-Number of Steps: 4 Home Layout: One level Home Equipment: Walker - 2 wheels;Cane - single point      Prior Function Level of Independence: Independent               Hand Dominance   Dominant Hand: Right    Extremity/Trunk Assessment   Upper Extremity Assessment: Defer to OT evaluation       LUE Deficits / Details: Noted muscle atrophy around shoulder girdle and pt reports numbness in L thumb and index finger. See OT note for more detail.    Lower Extremity Assessment: Overall WFL for tasks assessed      Cervical / Trunk Assessment: Other exceptions  Communication  Communication: No difficulties  Cognition Arousal/Alertness: Awake/alert Behavior During Therapy: WFL for tasks assessed/performed Overall Cognitive Status: Within Functional Limits for tasks assessed                      General Comments      Exercises      Assessment/Plan    PT Assessment Patent does not need any further PT services  PT Problem List            PT Treatment Interventions      PT Goals (Current goals can be found in the Care Plan section)  Acute Rehab PT Goals Patient Stated Goal: not stated PT Goal Formulation: All assessment and education complete, DC therapy    Frequency     Barriers to discharge        Co-evaluation               End of Session Equipment Utilized During Treatment: Cervical collar Activity Tolerance: Patient tolerated treatment well Patient left: in chair;with call bell/phone within reach;with family/visitor present Nurse Communication: Mobility status         Time: [id number] PT Time Calculation (min) (ACUTE ONLY): 27 min   Charges:   PT Evaluation $PT Eval Moderate Complexity: 1 Procedure PT Treatments $Gait Training: 8-22 mins   PT G Codes:        Marylynn PearsonLaura D Cora Stetson 06/06/2016, 2:37 PM   Conni SlipperLaura Deral Schellenberg, PT, DPT Acute Rehabilitation Services Pager: [phone]

## 2016-06-06 NOTE — Plan of Care (Signed)
Problem: Activity: Goal: Ability to avoid complications of mobility impairment will improve Outcome: Progressing Patient ambulated in room with RN, steady gait, tolerated well. Patient aware and cautious of neck and back precautions. Patient very eager to ambulate.  Problem: Pain Management: Goal: Pain level will decrease Outcome: Completed/Met Date Met: 06/06/16 Patient denies pain at 0500  Comments: Patient very eager to ambulate. Patient ambulated 10-15 ft in room with aspen collar in place and front wheeled walker for safety/steadying. Patient tolerated ambulation well.

## 2016-06-06 NOTE — Progress Notes (Signed)
Patient is being a/c home. Dc instructions given, patient and care giver verbalized understanding. Patient awaiting PT and 3 in 1 comode and transportation at this time.

## 2016-06-06 NOTE — Anesthesia Postprocedure Evaluation (Signed)
Anesthesia Post Note  Patient: Malik Hernandez  Procedure(s) Performed: Procedure(s) (LRB): LEFT CERVICAL SIX-SEVEN LAMINOTOMY, CERVICAL SIX-SEVEN DECOMPRESSIVE LAMINECTOMY, CERVICAL FIVE-SIX, CERVICAL SIX-SEVEN, CERVICAL SEVEN-THORACIC ONE DORSAL FUSION (N/A)  Patient location during evaluation: PACU Anesthesia Type: General Level of consciousness: awake, awake and alert and oriented Pain management: pain level controlled Vital Signs Assessment: post-procedure vital signs reviewed and stable Respiratory status: spontaneous breathing, nonlabored ventilation and respiratory function stable Cardiovascular status: blood pressure returned to baseline Anesthetic complications: no    Last Vitals:  Vitals:   06/05/16 2208 06/05/16 2224  BP: 126/89 126/68  Pulse: 66 65  Resp: 17 20  Temp: 36.7 C 36.9 C    Last Pain:  Vitals:   06/05/16 2224  TempSrc: Oral  PainSc:                  Brigette Hopfer COKER

## 2016-06-06 NOTE — Progress Notes (Signed)
At 0240 patients peripheral LW IV infiltrated. RN d/c RW IV, heat applied to above site and anterior forearm upon removal, swelling and pain relieved. Patient states no pain. RN will continue to monitor.
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
## 2016-06-06 NOTE — Evaluation (Signed)
Occupational Therapy Evaluation Patient Details Name: [place]had E Reposa MRN: [id number] DOB: 13-Feb-1976 Today's Date: 06/06/2016    History of Present Illness s/p posterior cervical fusion/foraminotomy level 3   Clinical Impression   Pt admitted with the above diagnoses and presents with below problem list. Pt will benefit from continued acute OT to address the below listed deficits and maximize independence with basic ADLs prior to d/c home. PTA pt was independent with ADLs. Pt is currently min guard with LB ADLs and functional transfers/mobility. ADL education provided. Pt needs further clarification on collar wearing schedule (ok to remove to shower?).     Follow Up Recommendations  No OT follow up;Supervision - Intermittent    Equipment Recommendations  3 in 1 bedside comode    Recommendations for Other Services       Precautions / Restrictions Precautions Precautions: Cervical Precaution Comments: handout provided Required Braces or Orthoses: Cervical Brace Cervical Brace: Hard collar Restrictions Weight Bearing Restrictions: No      Mobility Bed Mobility               General bed mobility comments: up in chair  Transfers Overall transfer level: Needs assistance Equipment used: None Transfers: Sit to/from Stand Sit to Stand: Min guard         General transfer comment: from recliner and regular height toilet    Balance Overall balance assessment: Needs assistance Sitting-balance support: No upper extremity supported;Feet supported Sitting balance-Leahy Scale: Good     Standing balance support: No upper extremity supported Standing balance-Leahy Scale: Good                              ADL Overall ADL's : Needs assistance/impaired Eating/Feeding: Set up;Sitting   Grooming: Set up;Supervision/safety;Sitting;Standing;Cueing for compensatory techniques   Upper Body Bathing: Set up;Sitting   Lower Body Bathing: Min guard;Sit to/from  stand   Upper Body Dressing : Set up;Sitting   Lower Body Dressing: Min guard;Sit to/from stand   Toilet Transfer: Min guard;Grab bars   Toileting- ArchitectClothing Manipulation and Hygiene: Min guard;Sit to/from stand   Tub/ Shower Transfer: Min guard;Ambulation   Functional mobility during ADLs: Min guard General ADL Comments: Pt completed in-room functional mobility, toilet transfer. ADL and collar education given to pt (how to adjust collar). Advised pt to seek further clarification from surgeon on collar wearing schedule and if he is ok to remove it to shower.      Vision     Perception     Praxis      Pertinent Vitals/Pain Pain Assessment: 0-10 Pain Score: 3  Pain Location: neck  Pain Descriptors / Indicators: Tightness Pain Intervention(s): Monitored during session;Repositioned     Hand Dominance Right   Extremity/Trunk Assessment Upper Extremity Assessment Upper Extremity Assessment: Overall WFL for tasks assessed;RUE deficits/detail;LUE deficits/detail RUE Deficits / Details: AROM shoulder flexion to about 90*. denied pain. LUE Deficits / Details: AROM shoulder flexion to about 90*. denied pain. intermittent numbess/tingling sensation in 1st and 2nd digits of left hand. Tricep strength 4/5.   Lower Extremity Assessment Lower Extremity Assessment: Defer to PT evaluation       Communication Communication Communication: No difficulties   Cognition Arousal/Alertness: Awake/alert Behavior During Therapy: WFL for tasks assessed/performed Overall Cognitive Status: Within Functional Limits for tasks assessed                     General Comments  Exercises Exercises: Other exercises Other Exercises Other Exercises: Discussed AROM exercise for LUE within pain-free zone. Pt reporting no pain with AROM this session. Advised pt to clear with MD prior to adding resistance to UE exercises.    Shoulder Instructions      Home Living Family/patient expects to  be discharged to:: Private residence Living Arrangements: Spouse/significant other;Children (teenager) Available Help at Discharge: Family;Available 24 hours/day Type of Home: House Home Access: Stairs to enter Entergy CorporationEntrance Stairs-Number of Steps: 4 Entrance Stairs-Rails: None Home Layout: One level     Bathroom Shower/Tub: Tub/shower unit Shower/tub characteristics: Engineer, building servicesCurtain Bathroom Toilet: Standard Bathroom Accessibility: Yes How Accessible: Accessible via walker Home Equipment: Walker - 2 wheels;Cane - single point          Prior Functioning/Environment Level of Independence: Independent                 OT Problem List: Impaired balance (sitting and/or standing);Decreased knowledge of use of DME or AE;Decreased knowledge of precautions;Pain   OT Treatment/Interventions: Self-care/ADL training;DME and/or AE instruction;Therapeutic activities;Patient/family education;Balance training    OT Goals(Current goals can be found in the care plan section) Acute Rehab OT Goals Patient Stated Goal: not stated OT Goal Formulation: With patient/family Time For Goal Achievement: 06/13/16 Potential to Achieve Goals: Good ADL Goals Pt Will Perform Grooming: with modified independence Pt Will Perform Lower Body Bathing: with modified independence;sit to/from stand Pt Will Perform Lower Body Dressing: with modified independence;sit to/from stand Pt Will Transfer to Toilet: with modified independence;ambulating Pt Will Perform Toileting - Clothing Manipulation and hygiene: with modified independence;sit to/from stand Pt Will Perform Tub/Shower Transfer: with modified independence;ambulating;3 in 1 Additional ADL Goal #1: Pt will complete bed mobility at mod I level to prepare for OOB ADLs.   OT Frequency: Min 2X/week   Barriers to D/C:            Co-evaluation              End of Session Equipment Utilized During Treatment: Cervical collar  Activity Tolerance: Patient  tolerated treatment well Patient left: in chair;with call bell/phone within reach;with family/visitor present   Time: 1005-1030 OT Time Calculation (min): 25 min Charges:  OT General Charges $OT Visit: 1 Procedure OT Evaluation $OT Eval Low Complexity: 1 Procedure OT Treatments $Self Care/Home Management : 8-22 mins G-Codes:    Pilar GrammesMathews, Jt Brabec H 06/06/2016, 10:46 AM

## 2016-06-06 NOTE — Care Management Note (Addendum)
Case Management Note  Patient Details  Name: Malik Hernandez MRN: [id number] Date of Birth: 1976/01/09  Subjective/Objective:   Pt underwent:  LEFT CERVICAL SIX-SEVEN LAMINOTOMY, CERVICAL SIX-SEVEN DECOMPRESSIVE LAMINECTOMY, CERVICAL FIVE-SIX, CERVICAL SIX-SEVEN, CERVICAL SEVEN-THORACIC ONE DORSAL FUSION.   He is from home.               Action/Plan: No f/u per OT. Awaiting PT recommendations. MD ordered a 3 in 1. Carollee HerterShannon with [facility] notified and will deliver the equipment to the room. Pt states he has plenty of support and help at home.   Expected Discharge Date:                  Expected Discharge Plan:  Home/Self Care  In-House Referral:     Discharge planning Services  CM Consult  Post Acute Care Choice:  Durable Medical Equipment Choice offered to:  Patient  DME Arranged:  3-N-1 DME Agency:  [facility]  HH Arranged:    HH Agency:     Status of Service:  Completed, signed off  If discussed at Long Length of Stay Meetings, dates discussed:    Additional Comments:  [name], RN 06/06/2016, 11:41 AM

## 2016-06-07 ENCOUNTER — Encounter (HOSPITAL_COMMUNITY): Payer: Self-pay | Admitting: Neurological Surgery

## 2016-06-07 ENCOUNTER — Encounter (HOSPITAL_COMMUNITY): Payer: Self-pay | Admitting: *Deleted
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
## 2016-06-13 ENCOUNTER — Emergency Department (HOSPITAL_COMMUNITY)
Admission: EM | Admit: 2016-06-13 | Discharge: 2016-06-13 | Disposition: A | Discharge status: Home / Self Care | Payer: BLUE CROSS/BLUE SHIELD | Attending: Emergency Medicine | Admitting: Emergency Medicine

## 2016-06-13 ENCOUNTER — Encounter (HOSPITAL_COMMUNITY): Payer: Self-pay | Admitting: Emergency Medicine

## 2016-06-13 ENCOUNTER — Emergency Department (HOSPITAL_COMMUNITY): Payer: BLUE CROSS/BLUE SHIELD

## 2016-06-13 DIAGNOSIS — F17290 Nicotine dependence, other tobacco product, uncomplicated: Secondary | ICD-10-CM | POA: Insufficient documentation

## 2016-06-13 DIAGNOSIS — G8918 Other acute postprocedural pain: Secondary | ICD-10-CM | POA: Diagnosis not present

## 2016-06-13 DIAGNOSIS — R0782 Intercostal pain: Secondary | ICD-10-CM | POA: Insufficient documentation

## 2016-06-13 DIAGNOSIS — Z79899 Other long term (current) drug therapy: Secondary | ICD-10-CM | POA: Diagnosis not present

## 2016-06-13 DIAGNOSIS — R079 Chest pain, unspecified: Secondary | ICD-10-CM | POA: Diagnosis present

## 2016-06-13 LAB — BASIC METABOLIC PANEL
Anion gap: 9 (ref 5–15)
BUN: 13 mg/dL (ref 6–20)
CHLORIDE: 99 mmol/L — AB (ref 101–111)
CO2: 26 mmol/L (ref 22–32)
Calcium: 9.3 mg/dL (ref 8.9–10.3)
Creatinine, Ser: 0.86 mg/dL (ref 0.61–1.24)
GFR calc Af Amer: >60 mL/min (ref >60)
GLUCOSE: 114 mg/dL — AB (ref 65–99)
Potassium: 3.8 mmol/L (ref 3.5–5.1)
SODIUM: 134 mmol/L — AB (ref 135–145)

## 2016-06-13 LAB — CBC
HEMATOCRIT: 41.1 % (ref 39.0–52.0)
Hemoglobin: 14.4 g/dL (ref 13.0–17.0)
MCH: 30.6 pg (ref 26.0–34.0)
MCHC: 35 g/dL (ref 30.0–36.0)
MCV: 87.4 fL (ref 78.0–100.0)
Platelets: 296 10*3/uL (ref 150–400)
RBC: 4.7 MIL/uL (ref 4.22–5.81)
RDW: 12.6 % (ref 11.5–15.5)
WBC: 8.8 10*3/uL (ref 4.0–10.5)

## 2016-06-13 LAB — I-STAT TROPONIN, ED: Troponin i, poc: 0 ng/mL (ref 0.00–0.08)

## 2016-06-13 MED ORDER — KETOROLAC TROMETHAMINE 30 MG/ML IJ SOLN
60.0000 mg | Freq: Once | INTRAMUSCULAR | Status: AC
  Administered 2016-06-13: 60 mg via INTRAMUSCULAR
  Filled 2016-06-13: qty 2

## 2016-06-13 MED ORDER — KETOROLAC TROMETHAMINE 30 MG/ML IJ SOLN: 30.0000 mg | Freq: Once | INTRAMUSCULAR | Status: DC
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
## 2016-07-02 MED FILL — METHOCARBAMOL 750 MG TABLET: 750 | 30 days supply | Qty: 120 | Fill #1

## 2016-07-02 MED FILL — GABAPENTIN 300 MG CAPSULE: 300 | 30 days supply | Qty: 90 | Fill #1

## 2016-07-04 ENCOUNTER — Other Ambulatory Visit: Payer: Self-pay | Admitting: Neurological Surgery

## 2016-07-09 ENCOUNTER — Encounter (HOSPITAL_COMMUNITY): Payer: Self-pay

## 2016-07-09 ENCOUNTER — Encounter (HOSPITAL_COMMUNITY)
Admission: RE | Admit: 2016-07-09 | Discharge: 2016-07-09 | Disposition: A | Discharge status: Home / Self Care | Payer: BLUE CROSS/BLUE SHIELD | Source: Ambulatory Visit | Attending: Neurological Surgery | Admitting: Neurological Surgery

## 2016-07-09 DIAGNOSIS — X58XXXA Exposure to other specified factors, initial encounter: Secondary | ICD-10-CM | POA: Diagnosis not present

## 2016-07-09 DIAGNOSIS — S13171A Dislocation of C6/C7 cervical vertebrae, initial encounter: Secondary | ICD-10-CM | POA: Diagnosis not present

## 2016-07-09 DIAGNOSIS — M5412 Radiculopathy, cervical region: Secondary | ICD-10-CM | POA: Diagnosis present

## 2016-07-09 DIAGNOSIS — Z79899 Other long term (current) drug therapy: Secondary | ICD-10-CM | POA: Diagnosis not present

## 2016-07-09 DIAGNOSIS — Z791 Long term (current) use of non-steroidal anti-inflammatories (NSAID): Secondary | ICD-10-CM | POA: Diagnosis not present

## 2016-07-09 DIAGNOSIS — Z9889 Other specified postprocedural states: Secondary | ICD-10-CM | POA: Diagnosis not present

## 2016-07-09 DIAGNOSIS — Z87891 Personal history of nicotine dependence: Secondary | ICD-10-CM | POA: Diagnosis not present

## 2016-07-09 DIAGNOSIS — M50123 Cervical disc disorder at C6-C7 level with radiculopathy: Secondary | ICD-10-CM | POA: Diagnosis not present

## 2016-07-09 HISTORY — DX: Headache: R51

## 2016-07-09 HISTORY — DX: Headache, unspecified: R51.9

## 2016-07-09 LAB — SURGICAL PCR SCREEN
MRSA, PCR: NEGATIVE
Staphylococcus aureus: NEGATIVE

## 2016-07-09 LAB — COMPREHENSIVE METABOLIC PANEL
ALBUMIN: 4.2 g/dL (ref 3.5–5.0)
ALT: 22 U/L (ref 17–63)
ANION GAP: 7 (ref 5–15)
AST: 24 U/L (ref 15–41)
Alkaline Phosphatase: 74 U/L (ref 38–126)
BUN: 12 mg/dL (ref 6–20)
CO2: 29 mmol/L (ref 22–32)
Calcium: 9.3 mg/dL (ref 8.9–10.3)
Chloride: 102 mmol/L (ref 101–111)
Creatinine, Ser: 0.85 mg/dL (ref 0.61–1.24)
GFR calc Af Amer: >60 mL/min (ref >60)
GFR calc non Af Amer: >60 mL/min (ref >60)
GLUCOSE: 89 mg/dL (ref 65–99)
POTASSIUM: 3.7 mmol/L (ref 3.5–5.1)
SODIUM: 138 mmol/L (ref 135–145)
Total Bilirubin: 1.2 mg/dL (ref 0.3–1.2)
Total Protein: 7.1 g/dL (ref 6.5–8.1)

## 2016-07-09 LAB — CBC
HCT: 42.8 % (ref 39.0–52.0)
HEMOGLOBIN: 14.8 g/dL (ref 13.0–17.0)
MCH: 30.3 pg (ref 26.0–34.0)
MCHC: 34.6 g/dL (ref 30.0–36.0)
MCV: 87.5 fL (ref 78.0–100.0)
Platelets: 164 10*3/uL (ref 150–400)
RBC: 4.89 MIL/uL (ref 4.22–5.81)
RDW: 13.6 % (ref 11.5–15.5)
WBC: 7.7 10*3/uL (ref 4.0–10.5)

## 2016-07-09 NOTE — Progress Notes (Signed)
Requested stress test. Ov, ? Echo from Dr.Ganji's office.

## 2016-07-11 ENCOUNTER — Ambulatory Visit (HOSPITAL_COMMUNITY): Payer: BLUE CROSS/BLUE SHIELD | Admitting: Certified Registered Nurse Anesthetist

## 2016-07-11 ENCOUNTER — Ambulatory Visit (HOSPITAL_COMMUNITY)
Admission: RE | Admit: 2016-07-11 | Discharge: 2016-07-11 | Disposition: A | Discharge status: Home / Self Care | Payer: BLUE CROSS/BLUE SHIELD | Source: Ambulatory Visit | Attending: Neurological Surgery | Admitting: Neurological Surgery

## 2016-07-11 ENCOUNTER — Encounter (HOSPITAL_COMMUNITY)
Admission: RE | Disposition: A | Discharge status: Home / Self Care | Payer: Self-pay | Source: Ambulatory Visit | Attending: Neurological Surgery

## 2016-07-11 ENCOUNTER — Encounter (HOSPITAL_COMMUNITY): Payer: Self-pay | Admitting: *Deleted

## 2016-07-11 ENCOUNTER — Ambulatory Visit (HOSPITAL_COMMUNITY): Payer: BLUE CROSS/BLUE SHIELD

## 2016-07-11 DIAGNOSIS — Z87891 Personal history of nicotine dependence: Secondary | ICD-10-CM | POA: Insufficient documentation

## 2016-07-11 DIAGNOSIS — X58XXXA Exposure to other specified factors, initial encounter: Secondary | ICD-10-CM | POA: Insufficient documentation

## 2016-07-11 DIAGNOSIS — S13171A Dislocation of C6/C7 cervical vertebrae, initial encounter: Secondary | ICD-10-CM | POA: Insufficient documentation

## 2016-07-11 DIAGNOSIS — S13101A Dislocation of unspecified cervical vertebrae, initial encounter: Secondary | ICD-10-CM | POA: Diagnosis present

## 2016-07-11 DIAGNOSIS — M50123 Cervical disc disorder at C6-C7 level with radiculopathy: Secondary | ICD-10-CM | POA: Diagnosis not present

## 2016-07-11 DIAGNOSIS — Z9889 Other specified postprocedural states: Secondary | ICD-10-CM | POA: Insufficient documentation

## 2016-07-11 DIAGNOSIS — S129XXA Fracture of neck, unspecified, initial encounter: Secondary | ICD-10-CM

## 2016-07-11 DIAGNOSIS — Z791 Long term (current) use of non-steroidal anti-inflammatories (NSAID): Secondary | ICD-10-CM | POA: Insufficient documentation

## 2016-07-11 DIAGNOSIS — Z79899 Other long term (current) drug therapy: Secondary | ICD-10-CM | POA: Insufficient documentation

## 2016-07-11 HISTORY — PX: ANTERIOR CERVICAL DECOMP/DISCECTOMY FUSION: SHX1161

## 2016-07-11 SURGERY — ANTERIOR CERVICAL DECOMPRESSION/DISCECTOMY FUSION 1 LEVEL
Anesthesia: General | Site: Spine Cervical

## 2016-07-11 MED ORDER — OXYCODONE-ACETAMINOPHEN 7.5-325 MG PO TABS: 1.0000 | ORAL_TABLET | ORAL | 0 refills | Status: AC | PRN

## 2016-07-11 MED ORDER — MENTHOL 3 MG MT LOZG
1.0000 | LOZENGE | OROMUCOSAL | Status: DC | PRN
  Filled 2016-07-11: qty 9

## 2016-07-11 MED ORDER — CEFAZOLIN IN D5W 1 GM/50ML IV SOLN
1.0000 g | Freq: Three times a day (TID) | INTRAVENOUS | Status: DC
  Administered 2016-07-11: 1 g via INTRAVENOUS
  Filled 2016-07-11: qty 50

## 2016-07-11 MED ORDER — FENTANYL CITRATE (PF) 100 MCG/2ML IJ SOLN
INTRAMUSCULAR | Status: DC | PRN
  Administered 2016-07-11 (×5): 50 ug via INTRAVENOUS

## 2016-07-11 MED ORDER — CEFAZOLIN SODIUM-DEXTROSE 2-3 GM-% IV SOLR
INTRAVENOUS | Status: DC | PRN
  Administered 2016-07-11: 2 g via INTRAVENOUS

## 2016-07-11 MED ORDER — BACITRACIN 50000 UNITS IM SOLR
INTRAMUSCULAR | Status: DC | PRN
  Administered 2016-07-11: 09:00:00

## 2016-07-11 MED ORDER — ONDANSETRON HCL 4 MG/2ML IJ SOLN
INTRAMUSCULAR | Status: DC | PRN
  Administered 2016-07-11: 4 mg via INTRAVENOUS

## 2016-07-11 MED ORDER — SUGAMMADEX SODIUM 200 MG/2ML IV SOLN
INTRAVENOUS | Status: AC
  Filled 2016-07-11: qty 2

## 2016-07-11 MED ORDER — ONDANSETRON HCL 4 MG/2ML IJ SOLN: 4.0000 mg | INTRAMUSCULAR | Status: DC | PRN

## 2016-07-11 MED ORDER — DEXAMETHASONE SODIUM PHOSPHATE 10 MG/ML IJ SOLN
INTRAMUSCULAR | Status: DC | PRN
  Administered 2016-07-11: 10 mg via INTRAVENOUS

## 2016-07-11 MED ORDER — FENTANYL CITRATE (PF) 100 MCG/2ML IJ SOLN
INTRAMUSCULAR | Status: AC
Start: 2016-07-11 — End: 2016-07-11
  Filled 2016-07-11: qty 2

## 2016-07-11 MED ORDER — LIDOCAINE-EPINEPHRINE 2 %-1:100000 IJ SOLN
INTRAMUSCULAR | Status: DC | PRN
  Administered 2016-07-11: 7.5 mL via INTRADERMAL

## 2016-07-11 MED ORDER — GABAPENTIN 300 MG PO CAPS
300.0000 mg | ORAL_CAPSULE | Freq: Three times a day (TID) | ORAL | Status: DC
  Administered 2016-07-11: 300 mg via ORAL
  Filled 2016-07-11: qty 1

## 2016-07-11 MED ORDER — BISACODYL 10 MG RE SUPP
10.0000 mg | Freq: Every day | RECTAL | Status: DC | PRN
Start: 2016-07-11 — End: 2016-07-12

## 2016-07-11 MED ORDER — PANTOPRAZOLE SODIUM 40 MG IV SOLR: 40.0000 mg | Freq: Every day | INTRAVENOUS | Status: DC

## 2016-07-11 MED ORDER — THROMBIN 5000 UNITS EX SOLR
CUTANEOUS | Status: DC | PRN
  Administered 2016-07-11: 10000 [IU] via TOPICAL

## 2016-07-11 MED ORDER — SODIUM CHLORIDE 0.9% FLUSH: 3.0000 mL | Freq: Two times a day (BID) | INTRAVENOUS | Status: DC

## 2016-07-11 MED ORDER — ONDANSETRON 4 MG PO TBDP: 4.0000 mg | ORAL_TABLET | Freq: Three times a day (TID) | ORAL | Status: DC | PRN

## 2016-07-11 MED ORDER — HEMOSTATIC AGENTS (NO CHARGE) OPTIME
TOPICAL | Status: DC | PRN
  Administered 2016-07-11: 1 via TOPICAL

## 2016-07-11 MED ORDER — BUPIVACAINE HCL (PF) 0.5 % IJ SOLN
INTRAMUSCULAR | Status: DC | PRN
  Administered 2016-07-11: 7.5 mL via PERINEURAL

## 2016-07-11 MED ORDER — SUCCINYLCHOLINE CHLORIDE 20 MG/ML IJ SOLN
INTRAMUSCULAR | Status: DC | PRN
  Administered 2016-07-11: 120 mg via INTRAVENOUS

## 2016-07-11 MED ORDER — PROPOFOL 10 MG/ML IV BOLUS
INTRAVENOUS | Status: DC | PRN
  Administered 2016-07-11: 200 mg via INTRAVENOUS

## 2016-07-11 MED ORDER — OXYCODONE HCL 5 MG/5ML PO SOLN
ORAL | Status: AC
  Administered 2016-07-11: 5 mg
  Filled 2016-07-11: qty 5

## 2016-07-11 MED ORDER — METHOCARBAMOL 500 MG PO TABS
ORAL_TABLET | ORAL | Status: AC
  Filled 2016-07-11: qty 2

## 2016-07-11 MED ORDER — LACTATED RINGERS IV SOLN
INTRAVENOUS | Status: DC | PRN
  Administered 2016-07-11 (×2): via INTRAVENOUS

## 2016-07-11 MED ORDER — FLEET ENEMA 7-19 GM/118ML RE ENEM
1.0000 | ENEMA | Freq: Once | RECTAL | Status: DC | PRN
Start: 2016-07-11 — End: 2016-07-12

## 2016-07-11 MED ORDER — METHOCARBAMOL 750 MG PO TABS: 750.0000 mg | ORAL_TABLET | Freq: Four times a day (QID) | ORAL | 2 refills | Status: AC

## 2016-07-11 MED ORDER — SENNA 8.6 MG PO TABS: 2.0000 | ORAL_TABLET | Freq: Every day | ORAL | Status: DC | PRN

## 2016-07-11 MED ORDER — PHENYLEPHRINE HCL 10 MG/ML IJ SOLN
INTRAMUSCULAR | Status: DC | PRN
  Administered 2016-07-11 (×2): 80 ug via INTRAVENOUS

## 2016-07-11 MED ORDER — SODIUM CHLORIDE 0.9% FLUSH: 3.0000 mL | INTRAVENOUS | Status: DC | PRN

## 2016-07-11 MED ORDER — PROPOFOL 10 MG/ML IV BOLUS
INTRAVENOUS | Status: AC
  Filled 2016-07-11: qty 40

## 2016-07-11 MED ORDER — THROMBIN 5000 UNITS EX SOLR
OROMUCOSAL | Status: DC | PRN
  Administered 2016-07-11: 09:00:00 via TOPICAL

## 2016-07-11 MED ORDER — DOCUSATE SODIUM 100 MG PO CAPS
100.0000 mg | ORAL_CAPSULE | Freq: Two times a day (BID) | ORAL | Status: DC
  Administered 2016-07-11: 100 mg via ORAL
  Filled 2016-07-11: qty 1

## 2016-07-11 MED ORDER — SUGAMMADEX SODIUM 200 MG/2ML IV SOLN
INTRAVENOUS | Status: DC | PRN
  Administered 2016-07-11: 200 mg via INTRAVENOUS

## 2016-07-11 MED ORDER — ARTIFICIAL TEARS OP OINT
TOPICAL_OINTMENT | OPHTHALMIC | Status: AC
  Filled 2016-07-11: qty 3.5

## 2016-07-11 MED ORDER — LIDOCAINE HCL (CARDIAC) 20 MG/ML IV SOLN
INTRAVENOUS | Status: DC | PRN
  Administered 2016-07-11: 60 mg via INTRAVENOUS

## 2016-07-11 MED ORDER — DEXAMETHASONE SODIUM PHOSPHATE 4 MG/ML IJ SOLN
2.0000 mg | Freq: Three times a day (TID) | INTRAMUSCULAR | Status: DC
  Administered 2016-07-11: 2 mg via INTRAVENOUS
  Filled 2016-07-11: qty 1

## 2016-07-11 MED ORDER — ACETAMINOPHEN 500 MG PO TABS
1000.0000 mg | ORAL_TABLET | Freq: Four times a day (QID) | ORAL | Status: DC
  Administered 2016-07-11: 1000 mg via ORAL
  Filled 2016-07-11: qty 2

## 2016-07-11 MED ORDER — ARTIFICIAL TEARS OP OINT
TOPICAL_OINTMENT | OPHTHALMIC | Status: DC | PRN
  Administered 2016-07-11: 1 via OPHTHALMIC

## 2016-07-11 MED ORDER — 0.9 % SODIUM CHLORIDE (POUR BTL) OPTIME
TOPICAL | Status: DC | PRN
  Administered 2016-07-11: 1000 mL

## 2016-07-11 MED ORDER — PHENOL 1.4 % MT LIQD
1.0000 | OROMUCOSAL | Status: DC | PRN
  Administered 2016-07-11: 1 via OROMUCOSAL
  Filled 2016-07-11: qty 177

## 2016-07-11 MED ORDER — SENNA 8.6 MG PO TABS
1.0000 | ORAL_TABLET | Freq: Two times a day (BID) | ORAL | Status: DC
  Administered 2016-07-11: 8.6 mg via ORAL
  Filled 2016-07-11: qty 1

## 2016-07-11 MED ORDER — LIDOCAINE 2% (20 MG/ML) 5 ML SYRINGE
INTRAMUSCULAR | Status: AC
  Filled 2016-07-11: qty 5

## 2016-07-11 MED ORDER — CELECOXIB 200 MG PO CAPS
200.0000 mg | ORAL_CAPSULE | Freq: Two times a day (BID) | ORAL | Status: DC
  Administered 2016-07-11: 200 mg via ORAL
  Filled 2016-07-11: qty 1

## 2016-07-11 MED ORDER — OXYCODONE HCL 5 MG PO TABS
5.0000 mg | ORAL_TABLET | ORAL | Status: DC | PRN
  Administered 2016-07-11: 10 mg via ORAL
  Filled 2016-07-11: qty 2

## 2016-07-11 MED ORDER — MIDAZOLAM HCL 2 MG/2ML IJ SOLN
INTRAMUSCULAR | Status: AC
  Filled 2016-07-11: qty 2

## 2016-07-11 MED ORDER — FENTANYL CITRATE (PF) 100 MCG/2ML IJ SOLN
INTRAMUSCULAR | Status: AC
  Filled 2016-07-11: qty 4

## 2016-07-11 MED ORDER — THROMBIN 5000 UNITS EX SOLR
CUTANEOUS | Status: AC
  Filled 2016-07-11: qty 15000

## 2016-07-11 MED ORDER — METHOCARBAMOL 750 MG PO TABS
750.0000 mg | ORAL_TABLET | Freq: Four times a day (QID) | ORAL | Status: DC
  Administered 2016-07-11: 750 mg via ORAL

## 2016-07-11 MED ORDER — ONDANSETRON HCL 4 MG/2ML IJ SOLN
INTRAMUSCULAR | Status: AC
  Filled 2016-07-11: qty 2

## 2016-07-11 MED ORDER — MIDAZOLAM HCL 5 MG/5ML IJ SOLN
INTRAMUSCULAR | Status: DC | PRN
  Administered 2016-07-11: 2 mg via INTRAVENOUS

## 2016-07-11 MED ORDER — LIDOCAINE-EPINEPHRINE (PF) 2 %-1:200000 IJ SOLN
INTRAMUSCULAR | Status: AC
  Filled 2016-07-11: qty 20

## 2016-07-11 MED ORDER — SUCCINYLCHOLINE CHLORIDE 200 MG/10ML IV SOSY
PREFILLED_SYRINGE | INTRAVENOUS | Status: AC
  Filled 2016-07-11: qty 10

## 2016-07-11 MED ORDER — ROCURONIUM BROMIDE 50 MG/5ML IV SOSY
PREFILLED_SYRINGE | INTRAVENOUS | Status: AC
  Filled 2016-07-11: qty 5

## 2016-07-11 MED ORDER — SODIUM CHLORIDE 0.9 % IV SOLN: 250.0000 mL | INTRAVENOUS | Status: DC

## 2016-07-11 MED ORDER — ZOLPIDEM TARTRATE 5 MG PO TABS: 5.0000 mg | ORAL_TABLET | Freq: Every evening | ORAL | Status: DC | PRN

## 2016-07-11 MED ORDER — SODIUM CHLORIDE 0.9 % IV SOLN: INTRAVENOUS | Status: DC

## 2016-07-11 MED ORDER — ROCURONIUM BROMIDE 100 MG/10ML IV SOLN
INTRAVENOUS | Status: DC | PRN
  Administered 2016-07-11: 50 mg via INTRAVENOUS
  Administered 2016-07-11 (×2): 10 mg via INTRAVENOUS

## 2016-07-11 SURGICAL SUPPLY — 69 items
ADH SKN CLS APL DERMABOND .7 (GAUZE/BANDAGES/DRESSINGS) ×1
BIT DRILL NEURO 2X3.1 SFT TUCH (MISCELLANEOUS) ×1 IMPLANT
BLADE SURG 11 STRL SS (BLADE) ×3 IMPLANT
BLADE ULTRA TIP 2M (BLADE) IMPLANT
BONE MATRIX OSTEOCEL PRO SM (Bone Implant) ×2 IMPLANT
BUR MATCHSTICK NEURO 3.0 LAGG (BURR) ×3 IMPLANT
CAGE COROENT SM 6X17X14 7 (Cage) ×1 IMPLANT
CAGE COROENT SM 6X17X14MM 7 (Cage) ×1 IMPLANT
CANISTER SUCT 3000ML PPV (MISCELLANEOUS) ×3 IMPLANT
CARTRIDGE OIL MAESTRO DRILL (MISCELLANEOUS) ×1 IMPLANT
CHLORAPREP W/TINT 26ML (MISCELLANEOUS) ×3 IMPLANT
DECANTER SPIKE VIAL GLASS SM (MISCELLANEOUS) ×3 IMPLANT
DERMABOND ADVANCED (GAUZE/BANDAGES/DRESSINGS) ×2
DERMABOND ADVANCED .7 DNX12 (GAUZE/BANDAGES/DRESSINGS) ×1 IMPLANT
DIFFUSER DRILL AIR PNEUMATIC (MISCELLANEOUS) ×3 IMPLANT
DRAPE C-ARM 42X72 X-RAY (DRAPES) ×6 IMPLANT
DRAPE HALF SHEET 40X57 (DRAPES) ×2 IMPLANT
DRAPE LAPAROTOMY 100X72 PEDS (DRAPES) ×3 IMPLANT
DRAPE MICROSCOPE LEICA (MISCELLANEOUS) IMPLANT
DRAPE POUCH INSTRU U-SHP 10X18 (DRAPES) ×3 IMPLANT
DRAPE SHEET LG 3/4 BI-LAMINATE (DRAPES) IMPLANT
DRILL NEURO 2X3.1 SOFT TOUCH (MISCELLANEOUS) ×3
DRSG OPSITE POSTOP 4X6 (GAUZE/BANDAGES/DRESSINGS) ×2 IMPLANT
ELECT COATED BLADE 2.86 ST (ELECTRODE) ×3 IMPLANT
ELECT REM PT RETURN 9FT ADLT (ELECTROSURGICAL) ×3
ELECTRODE REM PT RTRN 9FT ADLT (ELECTROSURGICAL) ×1 IMPLANT
EVACUATOR 1/8 PVC DRAIN (DRAIN) ×2 IMPLANT
GAUZE SPONGE 4X4 12PLY STRL (GAUZE/BANDAGES/DRESSINGS) IMPLANT
GAUZE SPONGE 4X4 16PLY XRAY LF (GAUZE/BANDAGES/DRESSINGS) IMPLANT
GLOVE BIO SURGEON STRL SZ8 (GLOVE) ×2 IMPLANT
GLOVE BIOGEL PI IND STRL 7.5 (GLOVE) ×1 IMPLANT
GLOVE BIOGEL PI IND STRL 8.5 (GLOVE) IMPLANT
GLOVE BIOGEL PI INDICATOR 7.5 (GLOVE) ×2
GLOVE BIOGEL PI INDICATOR 8.5 (GLOVE) ×2
GOWN STRL REUS W/ TWL LRG LVL3 (GOWN DISPOSABLE) ×1 IMPLANT
GOWN STRL REUS W/ TWL XL LVL3 (GOWN DISPOSABLE) ×1 IMPLANT
GOWN STRL REUS W/TWL LRG LVL3 (GOWN DISPOSABLE) ×6
GOWN STRL REUS W/TWL XL LVL3 (GOWN DISPOSABLE) ×6
HEMOSTAT POWDER KIT SURGIFOAM (HEMOSTASIS) ×3 IMPLANT
KIT BASIN OR (CUSTOM PROCEDURE TRAY) ×3 IMPLANT
KIT ROOM TURNOVER OR (KITS) ×3 IMPLANT
NDL HYPO 21X1.5 SAFETY (NEEDLE) ×1 IMPLANT
NDL SPNL 18GX3.5 QUINCKE PK (NEEDLE) IMPLANT
NDL SPNL 22GX3.5 QUINCKE BK (NEEDLE) ×1 IMPLANT
NEEDLE HYPO 21X1.5 SAFETY (NEEDLE) ×6 IMPLANT
NEEDLE SPNL 18GX3.5 QUINCKE PK (NEEDLE) ×3 IMPLANT
NEEDLE SPNL 22GX3.5 QUINCKE BK (NEEDLE) ×3 IMPLANT
NS IRRIG 1000ML POUR BTL (IV SOLUTION) ×3 IMPLANT
OIL CARTRIDGE MAESTRO DRILL (MISCELLANEOUS) ×3
PACK LAMINECTOMY NEURO (CUSTOM PROCEDURE TRAY) ×3 IMPLANT
PAD ARMBOARD 7.5X6 YLW CONV (MISCELLANEOUS) ×5 IMPLANT
PATTIES SURGICAL .5X1.5 (GAUZE/BANDAGES/DRESSINGS) ×3 IMPLANT
RUBBERBAND STERILE (MISCELLANEOUS) IMPLANT
SCREW COROENTSELF-TAP 4X14MM (Screw) ×6 IMPLANT
SPONGE INTESTINAL PEANUT (DISPOSABLE) ×5 IMPLANT
SPONGE SURGIFOAM ABS GEL SZ50 (HEMOSTASIS) ×2 IMPLANT
STAPLER VISISTAT 35W (STAPLE) ×3 IMPLANT
STOCKINETTE 6  STRL (DRAPES) ×2
STOCKINETTE 6 STRL (DRAPES) ×1 IMPLANT
SUT STRATAFIX MNCRL+ 3-0 PS-2 (SUTURE) ×2
SUT STRATAFIX MONOCRYL 3-0 (SUTURE) ×4
SUT VIC AB 3-0 SH 8-18 (SUTURE) ×3 IMPLANT
SUTURE STRATFX MNCRL+ 3-0 PS-2 (SUTURE) ×1 IMPLANT
SYR CONTROL 10ML LL (SYRINGE) ×2 IMPLANT
TOWEL OR 17X24 6PK STRL BLUE (TOWEL DISPOSABLE) ×6 IMPLANT
TOWEL OR 17X26 10 PK STRL BLUE (TOWEL DISPOSABLE) ×3 IMPLANT
TUBE CONNECTING 12'X1/4 (SUCTIONS) ×1
TUBE CONNECTING 12X1/4 (SUCTIONS) ×2 IMPLANT
WATER STERILE IRR 1000ML POUR (IV SOLUTION) ×3 IMPLANT
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
## 2016-07-11 NOTE — Anesthesia Postprocedure Evaluation (Signed)
Anesthesia Post Note  Patient: Malik Hernandez  Procedure(s) Performed: Procedure(s) (LRB): Cervical six - Cervical Seven  Anterior cervical decompression/discectomy/fusion (N/A)  Patient location during evaluation: PACU Anesthesia Type: General Level of consciousness: awake and alert Pain management: pain level controlled Vital Signs Assessment: post-procedure vital signs reviewed and stable Respiratory status: spontaneous breathing, nonlabored ventilation, respiratory function stable and patient connected to nasal cannula oxygen Cardiovascular status: blood pressure returned to baseline and stable Postop Assessment: no signs of nausea or vomiting Anesthetic complications: no       Last Vitals:  Vitals:   07/11/16 1230 07/11/16 1259  BP:  121/83  Pulse:  79  Resp: 15 16  Temp: 36.7 C 37.1 C    Last Pain:  Vitals:   07/11/16 1230  TempSrc:   PainSc: 2                  Jaquann Guarisco,JAMES TERRILL

## 2016-07-11 NOTE — Anesthesia Procedure Notes (Signed)
Procedure Name: Intubation Date/Time: 07/11/2016 7:45 AM Performed by: Fransisca KaufmannMEYER, Reeva Davern E Pre-anesthesia Checklist: Patient identified, Emergency Drugs available, Suction available and Patient being monitored Patient Re-evaluated:Patient Re-evaluated prior to inductionOxygen Delivery Method: Circle System Utilized Preoxygenation: Pre-oxygenation with 100% oxygen Intubation Type: IV induction Ventilation: Mask ventilation without difficulty Laryngoscope Size: Glidescope Grade View: Grade II Tube type: Oral Tube size: 7.5 mm Number of attempts: 1 Airway Equipment and Method: Oral airway,  Rigid stylet and Video-laryngoscopy Placement Confirmation: ETT inserted through vocal cords under direct vision,  positive ETCO2 and breath sounds checked- equal and bilateral Secured at: 25 cm Tube secured with: Tape Dental Injury: Teeth and Oropharynx as per pre-operative assessment

## 2016-07-11 NOTE — Transfer of Care (Signed)
Immediate Anesthesia Transfer of Care Note  Patient: Malik Hernandez  Procedure(s) Performed: Procedure(s) with comments: Cervical six - Cervical Seven  Anterior cervical decompression/discectomy/fusion (N/A) - Cervical six - Cervical Seven  Anterior cervical decompression/discectomy/fusion  Patient Location: PACU  Anesthesia Type:General  Level of Consciousness: awake, alert , oriented and sedated  Airway & Oxygen Therapy: Patient Spontanous Breathing and Patient connected to nasal cannula oxygen  Post-op Assessment: Report given to RN, Post -op Vital signs reviewed and stable and Patient moving all extremities  Post vital signs: Reviewed and stable  Last Vitals:  Vitals:   07/11/16 0551 07/11/16 1036  BP: 121/83 118/78  Pulse: 75 83  Resp: 20 13  Temp: 37.1 C 36.9 C    Last Pain:  Vitals:   07/11/16 1036  TempSrc:   PainSc: 0-No pain         Complications: No apparent anesthesia complications

## 2016-07-11 NOTE — Progress Notes (Signed)
Patients transport finally arrived, Rx, education, AVS discharged instructions given to patient with family at bedside and they verbalized understanding. No redness, no drainage, no swelling noted on incision site. Patient voiding well, MAE, walked in hallway with no difficulty. D/C via wheelchair.

## 2016-07-11 NOTE — H&P (Signed)
CC:  No chief complaint on file.   HPI: Malik Hernandez is a 40 y.o. 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  He presents for C6-7 ACDF.  PMH: Past Medical History:  Diagnosis Date  . Headache     PSH: Past Surgical History:  Procedure Laterality Date  . CERVICAL FUSION    . MYRINGOTOMY WITH TUBE PLACEMENT    . POSTERIOR CERVICAL FUSION/FORAMINOTOMY N/A 06/05/2016   Procedure: LEFT CERVICAL SIX-SEVEN LAMINOTOMY, CERVICAL SIX-SEVEN DECOMPRESSIVE LAMINECTOMY, CERVICAL FIVE-SIX, CERVICAL SIX-SEVEN, CERVICAL SEVEN-THORACIC ONE DORSAL FUSION;  Surgeon: Loura HaltBenjamin Jared Jonathyn Carothers, MD;  [facility];  Service: Neurosurgery;  Laterality: N/A;    SH: Social History  Substance Use Topics  . Smoking status: Former Smoker    Types: Cigars  . Smokeless tobacco: Never Used  . Alcohol use Yes     Comment: social    MEDS: Prior to Admission medications   Medication Sig Start Date End Date Taking? Authorizing Provider  gabapentin (NEURONTIN) 300 MG capsule Take 1 capsule (300 mg total) by mouth 3 (three) times daily. 06/06/16  Yes Loura HaltBenjamin Jared Nishi Neiswonger, MD  HYDROcodone-acetaminophen (NORCO) 7.5-325 MG tablet Take 1-2 tablets by mouth every 6 (six) hours as needed for moderate pain. Patient taking differently: Take 1-2 tablets by mouth every 6 (six) hours as needed for moderate pain. Depends on pain level if takes 1 or 2 tablets 06/06/16  Yes Loura HaltBenjamin Jared Bryton Romagnoli, MD  methocarbamol (ROBAXIN) 750 MG tablet Take 1 tablet (750 mg total) by mouth 4 (four) times daily. 06/06/16  Yes Loura HaltBenjamin Jared Sanaia Jasso, MD  naproxen (NAPROSYN) 500 MG tablet Take 1 tablet (500 mg total) by mouth 2 (two) times daily. Patient taking differently: Take 500 mg by mouth 2 (two) times daily as needed for mild pain.  12/19/15  Yes Ace GinsSerena Y Sam, PA-C  senna (SENOKOT) 8.6 MG TABS tablet Take 2  tablets by mouth daily as needed for mild constipation.   Yes Historical Provider, MD  ondansetron (ZOFRAN ODT) 4 MG disintegrating tablet Take 1 tablet (4 mg total) by mouth every 8 (eight) hours as needed for nausea or vomiting. 03/09/16   Danelle BerryLeisa Tapia, PA-C    ALLERGY: No Known Allergies  ROS: ROS  NEUROLOGIC EXAM: Awake, alert, oriented Memory and concentration grossly intact Speech fluent, appropriate CN grossly intact Motor exam: Upper Extremities Deltoid Bicep Tricep Grip  Right 5/5 5/5 5/5 5/5  Left 5/5 5/5 2/5 5/5   Lower Extremity IP Quad PF DF EHL  Right 5/5 5/5 5/5 5/5 5/5  Left 5/5 5/5 5/5 5/5 5/5   Sensation grossly intact to LT  IMAGING: No new imaging  IMPRESSION: - 40 y.o. male with traumatic disc herniation and cervical radiculopathy  PLAN: - C6-7 ACDF - We have had a long discussion about the risks and benefits of surgery as well as the alternatives and he wishes to proceed.

## 2016-07-11 NOTE — Discharge Summary (Signed)
Date of Admission: 07/11/2016  Date of Discharge: 07/11/16  Admission Diagnosis: Cervical radiculopathy, left C6-7 HNP  Discharge Diagnosis: Same  Procedure Performed: C6-7 ACDF  Attending: Loura HaltBenjamin Jared Ditty, MD  Hospital Course:  The patient was admitted for the above listed operation and had an uncomplicated post-operative course.  They were discharged in stable condition.  Follow up: 3 weeks  Allergies as of 07/11/2016   No Known Allergies     Medication List    STOP taking these medications   naproxen 500 MG tablet Commonly known as:  NAPROSYN     TAKE these medications   gabapentin 300 MG capsule Commonly known as:  NEURONTIN Take 1 capsule (300 mg total) by mouth 3 (three) times daily.   HYDROcodone-acetaminophen 7.5-325 MG tablet Commonly known as:  NORCO Take 1-2 tablets by mouth every 6 (six) hours as needed for moderate pain. What changed:  additional instructions   methocarbamol 750 MG tablet Commonly known as:  ROBAXIN Take 1 tablet (750 mg total) by mouth 4 (four) times daily.   ondansetron 4 MG disintegrating tablet Commonly known as:  ZOFRAN ODT Take 1 tablet (4 mg total) by mouth every 8 (eight) hours as needed for nausea or vomiting.   oxyCODONE-acetaminophen 7.5-325 MG tablet Commonly known as:  PERCOCET Take 1-2 tablets by mouth every 4 (four) hours as needed for severe pain.   senna 8.6 MG Tabs tablet Commonly known as:  SENOKOT Take 2 tablets by mouth daily as needed for mild constipation.

## 2016-07-11 NOTE — Brief Op Note (Signed)
07/11/2016  10:13 AM  PATIENT:  It[place] E Lehmkuhl  40 y.o. male  PRE-OPERATIVE DIAGNOSIS:  Cervical fracture, Radiculopathy, Cervical region  POST-OPERATIVE DIAGNOSIS:  Cervical fracture, Radiculopathy, Cervical region  PROCEDURE:  Procedure(s) with comments: Cervical six - Cervical Seven  Anterior cervical decompression/discectomy/fusion (N/A) - Cervical six - Cervical Seven  Anterior cervical decompression/discectomy/fusion  SURGEON:  Surgeon(s) and Role:    * Truitt MerleBenjamin Jared Ditty, MD - Primary    * Maeola HarmanJoseph Stern, MD - Assisting  PHYSICIAN ASSISTANT:   ASSISTANTS: Maeola HarmanJoseph Stern, MD  ANESTHESIA:   general  EBL:  Total I/O In: 1000 [I.V.:1000] Out: -   BLOOD ADMINISTERED:none  DRAINS: Hemovac  LOCAL MEDICATIONS USED:  MARCAINE    and LIDOCAINE   SPECIMEN:  No Specimen  DISPOSITION OF SPECIMEN:  N/A  COUNTS:  YES  TOURNIQUET:  * No tourniquets in log *  DICTATION: .Dragon Dictation  PLAN OF CARE: Obs then home today or tomorrow  PATIENT DISPOSITION:  PACU - hemodynamically stable.   Delay start of Pharmacological VTE agent (>24hrs) due to surgical blood loss or risk of bleeding: yes
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
## 2016-07-17 ENCOUNTER — Encounter (HOSPITAL_COMMUNITY): Payer: Self-pay | Admitting: Neurological Surgery

## 2016-07-31 MED FILL — GABAPENTIN 300 MG CAPSULE: 300 | 30 days supply | Qty: 90 | Fill #2

## 2016-07-31 MED FILL — METHOCARBAMOL 750 MG TABLET: 750 | 30 days supply | Qty: 120 | Fill #2

## 2016-10-21 DIAGNOSIS — M5412 Radiculopathy, cervical region: Secondary | ICD-10-CM | POA: Diagnosis not present

## 2016-10-21 DIAGNOSIS — M542 Cervicalgia: Secondary | ICD-10-CM | POA: Diagnosis not present

## 2016-10-21 DIAGNOSIS — M50123 Cervical disc disorder at C6-C7 level with radiculopathy: Secondary | ICD-10-CM | POA: Diagnosis not present

## 2016-10-21 DIAGNOSIS — M50122 Cervical disc disorder at C5-C6 level with radiculopathy: Secondary | ICD-10-CM | POA: Diagnosis not present

## 2016-10-23 DIAGNOSIS — M50123 Cervical disc disorder at C6-C7 level with radiculopathy: Secondary | ICD-10-CM | POA: Diagnosis not present

## 2016-10-23 DIAGNOSIS — M5412 Radiculopathy, cervical region: Secondary | ICD-10-CM | POA: Diagnosis not present

## 2016-10-23 DIAGNOSIS — M50122 Cervical disc disorder at C5-C6 level with radiculopathy: Secondary | ICD-10-CM | POA: Diagnosis not present

## 2016-10-23 DIAGNOSIS — M542 Cervicalgia: Secondary | ICD-10-CM | POA: Diagnosis not present

## 2016-10-25 ENCOUNTER — Encounter: Payer: Self-pay | Admitting: Physical Therapy

## 2016-10-25 ENCOUNTER — Ambulatory Visit: Payer: BLUE CROSS/BLUE SHIELD | Attending: Neurological Surgery | Admitting: Physical Therapy

## 2016-10-25 DIAGNOSIS — M62838 Other muscle spasm: Secondary | ICD-10-CM

## 2016-10-25 DIAGNOSIS — M542 Cervicalgia: Secondary | ICD-10-CM | POA: Insufficient documentation
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
## 2016-10-28 ENCOUNTER — Ambulatory Visit: Payer: 59 | Admitting: Physical Therapy

## 2016-10-28 DIAGNOSIS — M62838 Other muscle spasm: Secondary | ICD-10-CM | POA: Diagnosis not present

## 2016-10-28 DIAGNOSIS — M542 Cervicalgia: Secondary | ICD-10-CM | POA: Diagnosis not present

## 2016-10-28 NOTE — Patient Instructions (Addendum)
Issued cervical stretches from the cabinet, Cross postural syndrome exercises

## 2016-10-28 NOTE — Therapy (Signed)
[facility] Outpatient Rehabilitation [facility] [address] [place], [place], [address] Phone: [phone]   Fax:  [phone]  Physical Therapy Treatment  Patient Details  Name: Malik Hernandez MRN: [id number] Date of Birth: Dec 11, 1975 Referring Provider: Dr Sharlet Salina Dity   Encounter Date: 10/28/2016      PT End of Session - 10/28/16 0957    Visit Number 2   Number of Visits 16   Date for PT Re-Evaluation 12/20/16   Authorization Type BCBS    PT Start Time 0845   PT Stop Time 0947   PT Time Calculation (min) 62 min   Activity Tolerance Patient tolerated treatment well      Past Medical History:  Diagnosis Date  . Headache     Past Surgical History:  Procedure Laterality Date  . ANTERIOR CERVICAL DECOMP/DISCECTOMY FUSION N/A 07/11/2016   Procedure: Cervical six - Cervical Seven  Anterior cervical decompression/discectomy/fusion;  Surgeon: Loura Halt Ditty, MD;  Location: [facility] OR;  Service: Neurosurgery;  Laterality: N/A;  Cervical six - Cervical Seven  Anterior cervical decompression/discectomy/fusion  . CERVICAL FUSION    . MYRINGOTOMY WITH TUBE PLACEMENT    . POSTERIOR CERVICAL FUSION/FORAMINOTOMY N/A 06/05/2016   Procedure: LEFT CERVICAL SIX-SEVEN LAMINOTOMY, CERVICAL SIX-SEVEN DECOMPRESSIVE LAMINECTOMY, CERVICAL FIVE-SIX, CERVICAL SIX-SEVEN, CERVICAL SEVEN-THORACIC ONE DORSAL FUSION;  Surgeon: Loura Halt Ditty, MD;  Location: [facility] OR;  Service: Neurosurgery;  Laterality: N/A;    There were no vitals filed for this visit.      Subjective Assessment - 10/28/16 0851    Subjective Pain is consistent throughout the day. Does not really change with different positions and movements.   Currently in Pain? Yes   Pain Score 4    Pain Location Neck   Pain Orientation Left   Pain Descriptors / Indicators Aching   Pain Type Chronic pain                         [facility] Adult PT Treatment/Exercise - 10/28/16 0001      Neck Exercises:  Seated   Neck Retraction 10 reps;5 secs  chin tucks   Cervical Rotation Both;10 reps   Lateral Flexion 10 reps;Both     Shoulder Exercises: Standing   Extension Strengthening;Both;Theraband;20 reps   Theraband Level (Shoulder Extension) Level 2 (Red)   Row Both;Strengthening;20 reps;Theraband  Required cues to keep shoulders from hiking   Theraband Level (Shoulder Row) Level 2 (Red)   Retraction Limitations In doorway pec stretch; 3x 30 secs     Modalities   Modalities Moist Heat     Moist Heat Therapy   Number Minutes Moist Heat 15 Minutes   Moist Heat Location Cervical     Manual Therapy   Manual therapy comments sub-occipital release, upper trap release; soft tissue mobilization to parapspinals.      Neck Exercises: Stretches   Upper Trapezius Stretch 3 reps;20 seconds   Levator Stretch 3 reps;20 seconds                PT Education - 10/28/16 0955    Education provided Yes   Education Details HEP   Person(s) Educated Patient   Methods Verbal cues;Handout;Demonstration   Comprehension Verbalized understanding;Returned demonstration          PT Short Term Goals - 10/25/16 1002      PT SHORT TERM GOAL #1   Title Patient will report 2/10 pain at worst in the cervical apine    Time 4  Period Weeks   Status New     PT SHORT TERM GOAL #2   Title Patient will report decreased numbness into his hand    Time 4   Period Weeks   Status New     PT SHORT TERM GOAL #3   Title Patient will report decreased tenderness to palpation in upper traps    Time 4   Period Weeks   Status New     PT SHORT TERM GOAL #4   Title Patient will increase cervical flexion, extensin, and bilateral rotation by 20 degrees   Time 4   Period Weeks   Status New     PT SHORT TERM GOAL #5   Title Patient will be independent with HEP    Time 4   Period Weeks   Status New           PT Long Term Goals - 10/25/16 1004      PT LONG TERM GOAL #1   Title Patient will return  to the gym without increased pain in order to improve strength    Time 8   Period Weeks   Status New     PT LONG TERM GOAL #2   Title Patient will report no pain when lifting items in order to return to work if cleared by the surgeon    Time 8   Period Weeks   Status New     PT LONG TERM GOAL #3   Title Patient will demsotrate a 39% limitation on FOTO    Time 8   Period Weeks   Status New               Plan - 10/28/16 1000    Clinical Impression Statement Patient had a 4/10 patient at beginning of treatment. Said that he is used to it. Reviewed HEP; cues to keep from hiking shoulders. Did shoulder extension with red theraband; not able to keep his left elbow straight due to numbness. Introduced chin tucks, levator, pectoralis, and upper trap stretches all with cues to keep comfortable. Continued manual therapy to cervical muscles and upper traps. Said he felt looser after manual therapy. Applied HMP.   PT Next Visit Plan Review chin tucks; levator stretch, upper trap stretch, seated bilateral ER, continue with manual therapy. Modalities as needed.   PT Home Exercise Plan cervical rotation, upper trap trigger point release, scap retraction, shoulder extension; cervical stretches from cabinet (upper trap stretch, levator stretch, corner stretch, chin tuck)   Consulted and Agree with Plan of Care Patient      Patient will benefit from skilled therapeutic intervention in order to improve the following deficits and impairments:  Decreased activity tolerance, Decreased strength, Impaired sensation, Postural dysfunction, Impaired UE functional use, Increased muscle spasms, Increased fascial restricitons  Visit Diagnosis: Cervicalgia  Other muscle spasm     Problem List Patient Active Problem List   Diagnosis Date Noted  . 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 Cervical spine fracture (HCC) 06/05/2016  . C7 cervical fracture (HCC) 06/05/2016   During this  treatment session, the therapist was present, participating in and directing the treatment.  Sharlene Motts, SPTA 10/28/2016, 11:18 AM  Jannette Spanner, PTA [facility] [address] [place], [place], [address] Phone: [phone]   Fax:  [phone]  Name: Malik Hernandez MRN: [id number] Date of Birth: [birth year]

## 2016-10-31 ENCOUNTER — Encounter: Payer: Self-pay | Admitting: Physical Therapy

## 2016-10-31 ENCOUNTER — Ambulatory Visit: Payer: 59 | Admitting: Physical Therapy

## 2016-10-31 DIAGNOSIS — M542 Cervicalgia: Secondary | ICD-10-CM | POA: Diagnosis not present

## 2016-10-31 DIAGNOSIS — M62838 Other muscle spasm: Secondary | ICD-10-CM | POA: Diagnosis not present

## 2016-10-31 NOTE — Therapy (Signed)
[facility] Outpatient Rehabilitation [facility] [address] [place], [place], [address] Phone: [phone]   Fax:  [phone]  Physical Therapy Treatment  Patient Details  Name: Malik Hernandez MRN: [id number] Date of Birth: 10/06/1975 Referring Provider: Dr Sharlet Salina Dity   Encounter Date: 10/31/2016      PT End of Session - 10/31/16 1529    Visit Number 3   Number of Visits 16   Date for PT Re-Evaluation 12/20/16   Authorization Type BCBS    PT Start Time 0849   PT Stop Time 0940   PT Time Calculation (min) 51 min   Activity Tolerance Patient tolerated treatment well   Behavior During Therapy [facility] for tasks assessed/performed      Past Medical History:  Diagnosis Date  . Headache     Past Surgical History:  Procedure Laterality Date  . ANTERIOR CERVICAL DECOMP/DISCECTOMY FUSION N/A 07/11/2016   Procedure: Cervical six - Cervical Seven  Anterior cervical decompression/discectomy/fusion;  Surgeon: Loura Halt Ditty, MD;  Location: [facility] OR;  Service: Neurosurgery;  Laterality: N/A;  Cervical six - Cervical Seven  Anterior cervical decompression/discectomy/fusion  . CERVICAL FUSION    . MYRINGOTOMY WITH TUBE PLACEMENT    . POSTERIOR CERVICAL FUSION/FORAMINOTOMY N/A 06/05/2016   Procedure: LEFT CERVICAL SIX-SEVEN LAMINOTOMY, CERVICAL SIX-SEVEN DECOMPRESSIVE LAMINECTOMY, CERVICAL FIVE-SIX, CERVICAL SIX-SEVEN, CERVICAL SEVEN-THORACIC ONE DORSAL FUSION;  Surgeon: Loura Halt Ditty, MD;  Location: [facility] OR;  Service: Neurosurgery;  Laterality: N/A;    There were no vitals filed for this visit.      Subjective Assessment - 10/31/16 0906    Subjective Paitnet feels like he is not having any pain today. He is still having shooting pain at times when he turns his head the wrong way. He has been using the tennis ball at home and feels like the exercises help his muscles relax.  Some of the activities cause a slight headache but it does not last. He asked about going  back to the gym. He was advised he can do cardio activity but to avoid lifting with his upper extremitys.    Limitations House hold activities;Lifting   How long can you sit comfortably? N/A    How long can you stand comfortably? N/A    How long can you walk comfortably? N/A    Diagnostic tests Nothing post op   Patient Stated Goals To have less pain in his nck    Currently in Pain? No/denies                         [facility] Adult PT Treatment/Exercise - 10/31/16 0001      Neck Exercises: Seated   Neck Retraction 10 reps;5 secs  chin tucks   Cervical Rotation Limitations 2-3x to assess mobility      Shoulder Exercises: Standing   Extension Strengthening;Both;Theraband;20 reps   Theraband Level (Shoulder Extension) Level 3 (Green)   Extension Limitations 2x10 difficulty keeping elbow straight    Row Limitations green 2x10    Other Standing Exercises triceps extension 3x10 red      Moist Heat Therapy   Number Minutes Moist Heat 10 Minutes   Moist Heat Location Cervical     Manual Therapy   Manual therapy comments sub-occipital release, upper trap release; soft tissue mobilization to paraspinals. Iastym with focus on left upper trap.                 PT Education - 10/31/16 1354  Education provided Yes   Education Details reviewed HEP and symptom mangement    Person(s) Educated Patient   Methods Verbal cues;Demonstration;Handout   Comprehension Verbalized understanding;Returned demonstration          PT Short Term Goals - 10/31/16 1533      PT SHORT TERM GOAL #1   Title Patient will report 2/10 pain at worst in the cervical spine    Baseline no pain today in cervical spine but assess carryover    Time 4   Period Weeks   Status On-going     PT SHORT TERM GOAL #2   Title Patient will report decreased numbness into his hand    Baseline no change in numbness    Time 4   Period Weeks   Status On-going     PT SHORT TERM GOAL #3   Title  Patient will report decreased tenderness to palpation in upper traps    Baseline continued spasming    Time 4   Status On-going     PT SHORT TERM GOAL #4   Title Patient will increase cervical flexion, extensin, and bilateral rotation by 20 degrees   Time 4   Period Weeks   Status On-going     PT SHORT TERM GOAL #5   Title Patient will be independent with HEP    Time 4   Period Weeks   Status On-going           PT Long Term Goals - 10/25/16 1004      PT LONG TERM GOAL #1   Title Patient will return to the gym without increased pain in order to improve strength    Time 8   Period Weeks   Status New     PT LONG TERM GOAL #2   Title Patient will report no pain when lifting items in order to return to work if cleared by the surgeon    Time 8   Period Weeks   Status New     PT LONG TERM GOAL #3   Title Patient will demsotrate a 39% limitation on FOTO    Time 8   Period Weeks   Status New               Plan - 10/31/16 1531    Clinical Impression Statement Patient reported no pain today. Therapy advanced his band to red. Therapy also gave him tricpes extension. He has very little triceps extension on the left side. He has improved spasming in his neck. Therapy reviewed chin tucks nad stretches as well.    Rehab Potential Good   PT Frequency 2x / week   PT Duration 8 weeks   PT Treatment/Interventions ADLs/Self Care Home Management;Cryotherapy;Electrical Stimulation;Iontophoresis /ml Dexamethasone;Moist Heat;Ultrasound;Gait training;Stair training;Patient/family education;Neuromuscular re-education;Therapeutic exercise;Therapeutic activities;Manual techniques;Passive range of motion;Taping;Dry needling   PT Next Visit Plan Review chin tucks; levator stretch, upper trap stretch, seated bilateral ER, continue with manual therapy. Modalities as needed.   PT Home Exercise Plan cervical rotation, upper trap trigger point release, scap retraction, shoulder extension;  cervical stretches from cabinet (upper trap stretch, levator stretch, corner stretch, chin tuck)   Consulted and Agree with Plan of Care Patient      Patient will benefit from skilled therapeutic intervention in order to improve the following deficits and impairments:  Decreased activity tolerance, Decreased strength, Impaired sensation, Postural dysfunction, Impaired UE functional use, Increased muscle spasms, Increased fascial restricitons  Visit Diagnosis: Cervicalgia  Other muscle spasm  Problem List Patient Active Problem List   Diagnosis Date Noted  . 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 Cervical spine fracture (HCC) 06/05/2016  . C7 cervical fracture (HCC) 06/05/2016    Dessie Coma PT DPT  10/31/2016, 3:37 PM  [facility] [address] [place], [place], [address] Phone: [phone]   Fax:  [phone]  Name: Malik Hernandez MRN: [id number] Date of Birth: December 26, 1975

## 2016-11-04 ENCOUNTER — Ambulatory Visit: Payer: 59 | Admitting: Physical Therapy

## 2016-11-04 DIAGNOSIS — M542 Cervicalgia: Secondary | ICD-10-CM

## 2016-11-04 DIAGNOSIS — M62838 Other muscle spasm: Secondary | ICD-10-CM

## 2016-11-04 NOTE — Therapy (Signed)
[place], [place], [address] Phone: [phone]   Fax:  [phone]  Physical Therapy Treatment  Patient Details  Name: Malik Hernandez MRN: [id number] Date of Birth: 1975/12/14 Referring Provider: Dr Marland Kitchen Dity   Encounter Date: 11/04/2016      PT End of Session - 11/04/16 0852    Visit Number 4   Number of Visits 16   Date for PT Re-Evaluation 12/20/16   Authorization Type BCBS    PT Start Time 0850   PT Stop Time 0945   PT Time Calculation (min) 55 min      Past Medical History:  Diagnosis Date  . Headache     Past Surgical History:  Procedure Laterality Date  . ANTERIOR CERVICAL DECOMP/DISCECTOMY FUSION N/A 07/11/2016   Procedure: Cervical six - Cervical Seven  Anterior cervical decompression/discectomy/fusion;  Surgeon: Kevan Ny Ditty, MD;  Location: [facility];  Service: Neurosurgery;  Laterality: N/A;  Cervical six - Cervical Seven  Anterior cervical decompression/discectomy/fusion  . CERVICAL FUSION    . MYRINGOTOMY WITH TUBE PLACEMENT    . POSTERIOR CERVICAL FUSION/FORAMINOTOMY N/A 06/05/2016   Procedure: LEFT CERVICAL SIX-SEVEN LAMINOTOMY, CERVICAL SIX-SEVEN DECOMPRESSIVE LAMINECTOMY, CERVICAL FIVE-SIX, CERVICAL SIX-SEVEN, CERVICAL SEVEN-THORACIC ONE DORSAL FUSION;  Surgeon: Kevan Ny Ditty, MD;  Location: [facility];  Service: Neurosurgery;  Laterality: N/A;    There were no vitals filed for this visit.      Subjective Assessment - 11/04/16 0852    Subjective Sore, not really pain.    Currently in Pain? No/denies            [facility] PT Assessment - 11/04/16 0001      AROM   Cervical Flexion 20   Cervical Extension 30   Cervical - Right Side Bend 20   Cervical - Left Side Bend 20   Cervical - Right Rotation 35   Cervical - Left Rotation 15  25 after manual                     [facility] Adult PT Treatment/Exercise - 11/04/16 0001      Shoulder Exercises:  Pulleys   Flexion 2 minutes   Flexion Limitations focusing on scapular positioning/posture.      Moist Heat Therapy   Number Minutes Moist Heat 15 Minutes   Moist Heat Location Cervical     Manual Therapy   Manual therapy comments sub-occipital release, upper trap release; soft tissue mobilization to paraspinals. Iastym with focus on left upper trap.      Neck Exercises: Stretches   Levator Stretch 3 reps;20 seconds                  PT Short Term Goals - 11/04/16 0940      PT SHORT TERM GOAL #1   Title Patient will report 2/10 pain at worst in the cervical spine    Time 4   Period Weeks   Status Achieved     PT SHORT TERM GOAL #2   Title Patient will report decreased numbness into his hand    Baseline no change in numbness    Time 4   Period Weeks   Status On-going     PT SHORT TERM GOAL #3   Title Patient will report decreased tenderness to palpation in upper traps    Time 4   Period Weeks   Status Achieved     PT SHORT TERM GOAL #4   Title Patient  will increase cervical flexion, extensin, and bilateral rotation by 20 degrees   Baseline improving    Time 4   Period Weeks   Status On-going     PT SHORT TERM GOAL #5   Title Patient will be independent with HEP    Time 4   Period Weeks   Status Unable to assess           PT Long Term Goals - 10/25/16 1004      PT LONG TERM GOAL #1   Title Patient will return to the gym without increased pain in order to improve strength    Time 8   Period Weeks   Status New     PT LONG TERM GOAL #2   Title Patient will report no pain when lifting items in order to return to work if cleared by the surgeon    Time 8   Period Weeks   Status New     PT LONG TERM GOAL #3   Title Patient will demsotrate a 39% limitation on FOTO    Time 8   Period Weeks   Status New               Plan - 11/04/16 6045    Clinical Impression Statement Pt demonstrates improved AROM in all planes except left cervical  rotation. After manual treatment, left cervical rotation improved from 15 to 25 degrees. He reports pain levels are staying below level 2 and he reports decreased spasming in upper traps. STG# 1, #3 met.    PT Next Visit Plan Review chin tucks; levator stretch, upper trap stretch, seated bilateral ER, continue with manual therapy. Modalities as needed.   PT Home Exercise Plan cervical rotation, upper trap trigger point release, scap retraction, shoulder extension; cervical stretches from cabinet (upper trap stretch, levator stretch, corner stretch, chin tuck)   Consulted and Agree with Plan of Care Patient      Patient will benefit from skilled therapeutic intervention in order to improve the following deficits and impairments:  Decreased activity tolerance, Decreased strength, Impaired sensation, Postural dysfunction, Impaired UE functional use, Increased muscle spasms, Increased fascial restricitons  Visit Diagnosis: Cervicalgia  Other muscle spasm     Problem List Patient Active Problem List   Diagnosis Date Noted  . 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 Cervical spine fracture ([place]) 06/05/2016  . C7 cervical fracture ([place]) 06/05/2016    Dorene Ar, PTA 11/04/2016, 10:18 AM  [place], [place], [address] Phone: [phone]   Fax:  [phone]  Name: Malik E Iglehart MRN: [id number] Date of Birth: 24-Aug-1975

## 2016-11-07 ENCOUNTER — Ambulatory Visit: Payer: 59 | Admitting: Physical Therapy

## 2016-11-07 ENCOUNTER — Encounter: Payer: Self-pay | Admitting: Physical Therapy

## 2016-11-07 DIAGNOSIS — M542 Cervicalgia: Secondary | ICD-10-CM | POA: Diagnosis not present

## 2016-11-07 DIAGNOSIS — M62838 Other muscle spasm: Secondary | ICD-10-CM

## 2016-11-07 NOTE — Therapy (Signed)
[facility] [address] [place], [place], [address] Phone: [phone]   Fax:  [phone]  Physical Therapy Treatment  Patient Details  Name: Malik Hernandez MRN: [id number] Date of Birth: 30-Sep-1975 Referring Provider: Dr Sharlet Salina Dity   Encounter Date: 11/07/2016      PT End of Session - 11/07/16 1513    Visit Number 5   Number of Visits 16   Date for PT Re-Evaluation 12/20/16   Authorization Type BCBS    PT Start Time 0845   PT Stop Time 0937   PT Time Calculation (min) 52 min   Activity Tolerance Patient tolerated treatment well   Behavior During Therapy [facility] for tasks assessed/performed      Past Medical History:  Diagnosis Date  . Headache     Past Surgical History:  Procedure Laterality Date  . ANTERIOR CERVICAL DECOMP/DISCECTOMY FUSION N/A 07/11/2016   Procedure: Cervical six - Cervical Seven  Anterior cervical decompression/discectomy/fusion;  Surgeon: Loura Halt Ditty, MD;  Location: [facility];  Service: Neurosurgery;  Laterality: N/A;  Cervical six - Cervical Seven  Anterior cervical decompression/discectomy/fusion  . CERVICAL FUSION    . MYRINGOTOMY WITH TUBE PLACEMENT    . POSTERIOR CERVICAL FUSION/FORAMINOTOMY N/A 06/05/2016   Procedure: LEFT CERVICAL SIX-SEVEN LAMINOTOMY, CERVICAL SIX-SEVEN DECOMPRESSIVE LAMINECTOMY, CERVICAL FIVE-SIX, CERVICAL SIX-SEVEN, CERVICAL SEVEN-THORACIC ONE DORSAL FUSION;  Surgeon: Loura Halt Ditty, MD;  Location: [facility] OR;  Service: Neurosurgery;  Laterality: N/A;    There were no vitals filed for this visit.      Subjective Assessment - 11/07/16 0853    Subjective (P)  Patient reports over the past few days his neck has become very sore and stiff. It gets worse when he does his exercises. He also feels like his numbess in his hand is getting worse. He is going to his MD tomorrow.    Limitations (P)  House hold activities;Lifting   How long can you sit comfortably? (P)  N/A     How long can you stand comfortably? (P)  N/A    How long can you walk comfortably? (P)  N/A    Diagnostic tests (P)  Nothing post op                         [facility] Adult PT Treatment/Exercise - 11/07/16 0001      Shoulder Exercises: Standing   Extension Limitations 2x10 with yellow band spent time ducating the patient on technique. Patient was hiking his shoulders. He reports that at home he has been holdin  with his shoulders up    Row Limitations 2x10 yellow advised to just pull to neutral position while keeping shoulders down    Other Standing Exercises triceps extension improved today      Shoulder Exercises: Pulleys   Flexion 2 minutes   Flexion Limitations focusing on scapular positioning/posture.      Moist Heat Therapy   Number Minutes Moist Heat 10 Minutes   Moist Heat Location Cervical     Manual Therapy   Manual therapy comments sub-occipital release, upper trap release; soft tissue mobilization to paraspinals. Iastym with focus on left upper trap. IASTMY to bilateral upper traps to reduce spasming.                    PT Short Term Goals - 11/07/16 1517      PT SHORT TERM GOAL #1   Title Patient will report 2/10 pain at worst  in the cervical spine    Baseline significant incresase in pain    Time 4   Period Weeks   Status Achieved     PT SHORT TERM GOAL #2   Title Patient will report decreased numbness into his hand    Baseline no change in numbness    Time 4   Period Weeks   Status On-going     PT SHORT TERM GOAL #3   Title Patient will report decreased tenderness to palpation in upper traps    Baseline continued spasming and significant pain    Time 4   Period Weeks   Status On-going     PT SHORT TERM GOAL #4   Title Patient will increase cervical flexion, extensin, and bilateral rotation by 20 degrees   Baseline improving    Time 4   Period Weeks   Status On-going     PT SHORT TERM GOAL #5   Title Patient will be  independent with HEP    Time 4   Period Weeks   Status On-going           PT Long Term Goals - 10/25/16 1004      PT LONG TERM GOAL #1   Title Patient will return to the gym without increased pain in order to improve strength    Time 8   Period Weeks   Status New     PT LONG TERM GOAL #2   Title Patient will report no pain when lifting items in order to return to work if cleared by the surgeon    Time 8   Period Weeks   Status New     PT LONG TERM GOAL #3   Title Patient will demsotrate a 39% limitation on FOTO    Time 8   Period Weeks   Status New               Plan - 11/07/16 1514    Clinical Impression Statement Therapy reviewed technique with the patient with his exercises. He wasperfroming some of the light exercises with too much emphasis on the wrong muslces. His technique was corrected and he was advised to rest for a few days. He also reports he has been doing and exercise were he pushes his head against the wall. He was advised not to do that exercise. He was not given that exercise by therapy. He had been making good progress. With some rest and improvement in techniue he should be able to start making progress agian. Therapy will re-assess patient per MD order next visit. He was advised to rest tomorrow then begin light exercises on Saturday. he was advised to hold on upper trap and chin tucks.    Rehab Potential Good   PT Frequency 2x / week   PT Duration 8 weeks   PT Treatment/Interventions ADLs/Self Care Home Management;Cryotherapy;Electrical Stimulation;Iontophoresis /ml Dexamethasone;Moist Heat;Ultrasound;Gait training;Stair training;Patient/family education;Neuromuscular re-education;Therapeutic exercise;Therapeutic activities;Manual techniques;Passive range of motion;Taping;Dry needling   PT Next Visit Plan re-assess reaction to last treatment. Continue to progress as tolerated    PT Home Exercise Plan cervical rotation, upper trap trigger point  release, scap retraction, shoulder extension; cervical stretches from cabinet (upper trap stretch, levator stretch, corner stretch, chin tuck)   Consulted and Agree with Plan of Care Patient      Patient will benefit from skilled therapeutic intervention in order to improve the following deficits and impairments:  Decreased activity tolerance, Decreased strength, Impaired sensation, Postural dysfunction, Impaired UE functional  use, Increased muscle spasms, Increased fascial restricitons  Visit Diagnosis: Cervicalgia  Other muscle spasm     Problem List Patient Active Problem List   Diagnosis Date Noted  . 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 Cervical spine fracture ([facility]) 06/05/2016  . C7 cervical fracture ([facility]) 06/05/2016    Dessie Coma PT DPT  11/07/2016, 3:22 PM  [facility] [address] [place], [place], [address] Phone: [phone]   Fax:  [phone]  Name: Malik E Harvie MRN: [id number] Date of Birth: [birth year]

## 2016-11-11 ENCOUNTER — Encounter: Payer: Self-pay | Admitting: Physical Therapy

## 2016-11-11 ENCOUNTER — Ambulatory Visit: Payer: 59 | Admitting: Physical Therapy

## 2016-11-11 DIAGNOSIS — M62838 Other muscle spasm: Secondary | ICD-10-CM

## 2016-11-11 DIAGNOSIS — M542 Cervicalgia: Secondary | ICD-10-CM | POA: Diagnosis not present

## 2016-11-11 NOTE — Therapy (Signed)
[facility] [address] [place], [place], [address] Phone: [phone]   Fax:  [phone]  Physical Therapy Treatment  Patient Details  Name: Malik Hernandez MRN: [id number] Date of Birth: 1976-05-15 Referring Provider: Dr Sharlet Salina Dity   Encounter Date: 11/11/2016      PT End of Session - 11/11/16 1116    Visit Number 6   Number of Visits 16   Date for PT Re-Evaluation 12/20/16   Authorization Type BCBS    PT Start Time 0847   PT Stop Time 0940   PT Time Calculation (min) 53 min   Activity Tolerance Patient tolerated treatment well   Behavior During Therapy [facility] for tasks assessed/performed      Past Medical History:  Diagnosis Date  . Headache     Past Surgical History:  Procedure Laterality Date  . ANTERIOR CERVICAL DECOMP/DISCECTOMY FUSION N/A 07/11/2016   Procedure: Cervical six - Cervical Seven  Anterior cervical decompression/discectomy/fusion;  Surgeon: Loura Halt Ditty, MD;  Location: [facility] OR;  Service: Neurosurgery;  Laterality: N/A;  Cervical six - Cervical Seven  Anterior cervical decompression/discectomy/fusion  . CERVICAL FUSION    . MYRINGOTOMY WITH TUBE PLACEMENT    . POSTERIOR CERVICAL FUSION/FORAMINOTOMY N/A 06/05/2016   Procedure: LEFT CERVICAL SIX-SEVEN LAMINOTOMY, CERVICAL SIX-SEVEN DECOMPRESSIVE LAMINECTOMY, CERVICAL FIVE-SIX, CERVICAL SIX-SEVEN, CERVICAL SEVEN-THORACIC ONE DORSAL FUSION;  Surgeon: Loura Halt Ditty, MD;  Location: [facility] OR;  Service: Neurosurgery;  Laterality: N/A;    There were no vitals filed for this visit.      Subjective Assessment - 11/11/16 0853    Subjective Stated his pain has increased and even thought he rested he is still experiencing pain and tightness. After his increase of cervical motion, he moved his neck a lot, and when he woke up the next morning it was more painful and stiff. He also says that he has a headache all the time,   Patient Stated Goals to go back  to work   Currently in Pain? Yes   Pain Score 5    Pain Location Neck   Pain Orientation Left;Mid   Pain Descriptors / Indicators Aching   Pain Type Chronic pain   Aggravating Factors  sitting, not doing anything   Pain Relieving Factors getting it loose                         [facility] Adult PT Treatment/Exercise - 11/11/16 0001      Shoulder Exercises: Standing   Extension Strengthening;Both   Extension Limitations 2x10 red   Row Limitations 2x10 red     Moist Heat Therapy   Number Minutes Moist Heat 10 Minutes   Moist Heat Location Cervical     Manual Therapy   Manual therapy comments sub-occipital release, upper trap release; soft tissue mobilization to paraspinals. focus on left upper traps ; upper trap work and paraspinals in prone to decrease muscle spasms and tightness                PT Education - 11/11/16 1115    Education provided Yes   Education Details suboccipital release with tennis balls   Person(s) Educated Patient   Methods Explanation;Demonstration;Verbal cues   Comprehension Verbalized understanding;Returned demonstration          PT Short Term Goals - 11/07/16 1517      PT SHORT TERM GOAL #1   Title Patient will report 2/10 pain at worst in the cervical spine  Baseline significant incresase in pain    Time 4   Period Weeks   Status Achieved     PT SHORT TERM GOAL #2   Title Patient will report decreased numbness into his hand    Baseline no change in numbness    Time 4   Period Weeks   Status On-going     PT SHORT TERM GOAL #3   Title Patient will report decreased tenderness to palpation in upper traps    Baseline continued spasming and significant pain    Time 4   Period Weeks   Status On-going     PT SHORT TERM GOAL #4   Title Patient will increase cervical flexion, extensin, and bilateral rotation by 20 degrees   Baseline improving    Time 4   Period Weeks   Status On-going     PT SHORT TERM GOAL #5    Title Patient will be independent with HEP    Time 4   Period Weeks   Status On-going           PT Long Term Goals - 10/25/16 1004      PT LONG TERM GOAL #1   Title Patient will return to the gym without increased pain in order to improve strength    Time 8   Period Weeks   Status New     PT LONG TERM GOAL #2   Title Patient will report no pain when lifting items in order to return to work if cleared by the surgeon    Time 8   Period Weeks   Status New     PT LONG TERM GOAL #3   Title Patient will demsotrate a 39% limitation on FOTO    Time 8   Period Weeks   Status New               Plan - 11/11/16 1110    Clinical Impression Statement Pt reports that he rested as advised but that it made things worse because he was really tight when he went to do his exercises yesterday. Was able to complete the pulleys with cues to sit up straight. Completed standing rows and exetension with good scapular control. Spent most of session working on [employer] of traps, paraspinals, and cervical area. Pt able to lie prone so had patient prone for massage. Performed supoccipital release in supine and instructed patient on how to use tennis balls for suboccipital release. Applied HMP and reported felt better and looser after treatment,   Rehab Potential Good   PT Frequency 2x / week   PT Duration 8 weeks   PT Treatment/Interventions ADLs/Self Care Home Management;Cryotherapy;Electrical Stimulation;Iontophoresis /ml Dexamethasone;Moist Heat;Ultrasound;Gait training;Stair training;Patient/family education;Neuromuscular re-education;Therapeutic exercise;Therapeutic activities;Manual techniques;Passive range of motion;Taping;Dry needling   PT Next Visit Plan re-assess reaction to last treatment. Continue to progress as tolerated; contemplate e-stim for pain if needed; check to see if suboccipital release with tennis balls is helping headache   PT Home Exercise Plan cervical rotation, upper trap  trigger point release, scap retraction, shoulder extension; cervical stretches from cabinet (upper trap stretch, levator stretch, corner stretch, chin tuck)   Consulted and Agree with Plan of Care Patient      Patient will benefit from skilled therapeutic intervention in order to improve the following deficits and impairments:  Decreased activity tolerance, Decreased strength, Impaired sensation, Postural dysfunction, Impaired UE functional use, Increased muscle spasms, Increased fascial restricitons  Visit Diagnosis: Cervicalgia  Other muscle spasm  Problem List Patient Active Problem List   Diagnosis Date Noted  . 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 Cervical spine fracture ([facility]) 06/05/2016  . C7 cervical fracture [facility]) 06/05/2016    Sharlene Motts, SPTA 11/11/2016, 11:19 AM  [facility] [address] [place], [place], [address] Phone: [phone]   Fax:  [phone]  Name: Malik E Luscher MRN: [id number] Date of Birth: 1975-11-26

## 2016-11-14 ENCOUNTER — Ambulatory Visit: Payer: 59 | Admitting: Physical Therapy

## 2016-11-14 ENCOUNTER — Encounter: Payer: Self-pay | Admitting: Physical Therapy

## 2016-11-14 DIAGNOSIS — M62838 Other muscle spasm: Secondary | ICD-10-CM

## 2016-11-14 DIAGNOSIS — M542 Cervicalgia: Secondary | ICD-10-CM | POA: Diagnosis not present

## 2016-11-14 NOTE — Therapy (Signed)
[facility] [address] [place], [place], [address] Phone: [phone]   Fax:  [phone]  Physical Therapy Treatment  Patient Details  Name: Malik Hernandez MRN: [id number] Date of Birth: 1975-11-21 Referring Provider: Dr Sharlet Salina Dity   Encounter Date: 11/14/2016      PT End of Session - 11/14/16 0929    Visit Number 7   Number of Visits 16   Date for PT Re-Evaluation 12/20/16   Authorization Type BCBS    PT Start Time 0848   PT Stop Time 0924   PT Time Calculation (min) 36 min   Activity Tolerance Patient tolerated treatment well   Behavior During Therapy [facility] for tasks assessed/performed      Past Medical History:  Diagnosis Date  . Headache     Past Surgical History:  Procedure Laterality Date  . ANTERIOR CERVICAL DECOMP/DISCECTOMY FUSION N/A 07/11/2016   Procedure: Cervical six - Cervical Seven  Anterior cervical decompression/discectomy/fusion;  Surgeon: Loura Halt Ditty, MD;  Location: [facility] OR;  Service: Neurosurgery;  Laterality: N/A;  Cervical six - Cervical Seven  Anterior cervical decompression/discectomy/fusion  . CERVICAL FUSION    . MYRINGOTOMY WITH TUBE PLACEMENT    . POSTERIOR CERVICAL FUSION/FORAMINOTOMY N/A 06/05/2016   Procedure: LEFT CERVICAL SIX-SEVEN LAMINOTOMY, CERVICAL SIX-SEVEN DECOMPRESSIVE LAMINECTOMY, CERVICAL FIVE-SIX, CERVICAL SIX-SEVEN, CERVICAL SEVEN-THORACIC ONE DORSAL FUSION;  Surgeon: Loura Halt Ditty, MD;  Location: [facility] OR;  Service: Neurosurgery;  Laterality: N/A;    There were no vitals filed for this visit.      Subjective Assessment - 11/14/16 0852    Subjective Pt stated he felt good after last visit. Said his shoulders and neck feel less tense but, he still has a headache. Tried the tennis balls and that helped the headache some.   Currently in Pain? Yes   Pain Score 2    Pain Location Neck   Pain Orientation Left   Pain Type Chronic pain   Aggravating Factors   sitting, not doing anything   Pain Relieving Factors stretches                         [facility] Adult PT Treatment/Exercise - 11/14/16 0001      Neck Exercises: Seated   Other Seated Exercise chin tucks x 5 hold 10 seconds each     Shoulder Exercises: Supine   Other Supine Exercises supine scapular stabilization series x 10 each green; both sides with sash; required mod verbal cues to keep shoulders away from ears     Manual Therapy   Manual therapy comments sub-occipital release, upper trap release; soft tissue mobilization to paraspinals; upper trap work and paraspinals to decrease muscle spasms and tightness     Neck Exercises: Stretches   Levator Stretch 2 reps;20 seconds                  PT Short Term Goals - 11/14/16 1050      PT SHORT TERM GOAL #1   Title Patient will report 2/10 pain at worst in the cervical spine    Baseline Patient stated sometimes he is at a 2/10 pain. That is goes between a 2 and 3/10.   Time 4   Period Weeks   Status Achieved     PT SHORT TERM GOAL #2   Title Patient will report decreased numbness into his hand    Baseline no change in numbness    Time 4   Period  Weeks   Status On-going     PT SHORT TERM GOAL #3   Title Patient will report decreased tenderness to palpation in upper traps    Baseline continued spasming and decrease in pain   Time 4   Period Weeks   Status On-going     PT SHORT TERM GOAL #4   Title Patient will increase cervical flexion, extensin, and bilateral rotation by 20 degrees   Baseline improving    Time 4   Period Weeks   Status On-going     PT SHORT TERM GOAL #5   Title Patient will be independent with HEP    Baseline Able to perform chin tucks and levator stretch with min cues.    Time 4   Period Weeks   Status On-going           PT Long Term Goals - 10/25/16 1004      PT LONG TERM GOAL #1   Title Patient will return to the gym without increased pain in order to improve  strength    Time 8   Period Weeks   Status New     PT LONG TERM GOAL #2   Title Patient will report no pain when lifting items in order to return to work if cleared by the surgeon    Time 8   Period Weeks   Status New     PT LONG TERM GOAL #3   Title Patient will demsotrate a 39% limitation on FOTO    Time 8   Period Weeks   Status New               Plan - 11/14/16 1042    Clinical Impression Statement Pt reports that the pain is better and he has been able to do more things with less pain. He feels that his stretches and exercises have been helping to keep him loose. He likes the tennis ball suboccipital release and stated that it helped his headache go away. Performed neck stretches and scapular stabilization series. Advised to discontinue stretches if causes increase in pain. Required mod cues and tactile reminders to avoid pulling shoulders to ears during scapular series. Spent some time continuing manual therapy as patient feels it helps keep him loose enough to perform his exercises at home and helps relieve muscle spasms. Patient stated he felt looser after session. Inquired if it was ok for him to start going to gym. Advised patient to keep exercises light and told patient would instruct on proper technique on machines next visit.     Rehab Potential Good   PT Frequency 2x / week   PT Duration 8 weeks   PT Treatment/Interventions ADLs/Self Care Home Management;Cryotherapy;Electrical Stimulation;Iontophoresis /ml Dexamethasone;Moist Heat;Ultrasound;Gait training;Stair training;Patient/family education;Neuromuscular re-education;Therapeutic exercise;Therapeutic activities;Manual techniques;Passive range of motion;Taping;Dry needling   PT Next Visit Plan re-assess reaction to last treatment, add scapular stabilization exercises to HEP if tolerated well. Continue to progress as tolerated; contemplate e-stim for pain if needed; instruct patient on proper technique on fitness  machines;   PT Home Exercise Plan cervical rotation, upper trap trigger point release, scap retraction, shoulder extension; cervical stretches from cabinet (upper trap stretch, levator stretch, corner stretch, chin tuck)   Consulted and Agree with Plan of Care Patient      Patient will benefit from skilled therapeutic intervention in order to improve the following deficits and impairments:  Decreased activity tolerance, Decreased strength, Impaired sensation, Postural dysfunction, Impaired UE functional use, Increased muscle spasms, Increased  fascial restricitons  Visit Diagnosis: Cervicalgia  Other muscle spasm     Problem List Patient Active Problem List   Diagnosis Date Noted  . 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 Cervical spine fracture (HCC) 06/05/2016  . C7 cervical fracture (HCC) 06/05/2016    Sharlene Motts, SPTA 11/14/2016, 1:13 PM  [facility] [address] [place], [place], [address] Phone: [phone]   Fax:  [phone]  Name: Malik E Wethington MRN: [id number] Date of Birth: Dec 17, 1975

## 2016-11-18 ENCOUNTER — Ambulatory Visit: Payer: 59 | Admitting: Physical Therapy

## 2016-11-21 ENCOUNTER — Encounter: Payer: Self-pay | Admitting: Physical Therapy

## 2016-11-21 ENCOUNTER — Ambulatory Visit: Payer: 59 | Attending: Neurological Surgery | Admitting: Physical Therapy

## 2016-11-21 DIAGNOSIS — M62838 Other muscle spasm: Secondary | ICD-10-CM | POA: Insufficient documentation

## 2016-11-21 DIAGNOSIS — M542 Cervicalgia: Secondary | ICD-10-CM | POA: Diagnosis not present

## 2016-11-21 NOTE — Therapy (Signed)
[facility] [address] [place], [place], [address] Phone: [phone]   Fax:  [phone]  Physical Therapy Treatment  Patient Details  Name: Malik Hernandez MRN: [id number] Date of Birth: 11/28/1975 Referring Provider: Dr Sharlet Salina Dity   Encounter Date: 11/21/2016      PT End of Session - 11/21/16 0918    Visit Number 8   Number of Visits 16   Date for PT Re-Evaluation 12/20/16   Authorization Type BCBS    PT Start Time 0846   PT Stop Time 0928   PT Time Calculation (min) 42 min   Activity Tolerance Patient tolerated treatment well   Behavior During Therapy [facility] for tasks assessed/performed      Past Medical History:  Diagnosis Date  . Headache     Past Surgical History:  Procedure Laterality Date  . ANTERIOR CERVICAL DECOMP/DISCECTOMY FUSION N/A 07/11/2016   Procedure: Cervical six - Cervical Seven  Anterior cervical decompression/discectomy/fusion;  Surgeon: Loura Halt Ditty, MD;  Location: [facility] OR;  Service: Neurosurgery;  Laterality: N/A;  Cervical six - Cervical Seven  Anterior cervical decompression/discectomy/fusion  . CERVICAL FUSION    . MYRINGOTOMY WITH TUBE PLACEMENT    . POSTERIOR CERVICAL FUSION/FORAMINOTOMY N/A 06/05/2016   Procedure: LEFT CERVICAL SIX-SEVEN LAMINOTOMY, CERVICAL SIX-SEVEN DECOMPRESSIVE LAMINECTOMY, CERVICAL FIVE-SIX, CERVICAL SIX-SEVEN, CERVICAL SEVEN-THORACIC ONE DORSAL FUSION;  Surgeon: Loura Halt Ditty, MD;  Location: [facility] OR;  Service: Neurosurgery;  Laterality: N/A;    There were no vitals filed for this visit.      Subjective Assessment - 11/21/16 0851    Subjective Patien trpeorts his pain in his neck and shoulder and his headache hav improved. He has been to the MD who reports there is nothing damaged in his neck at this time. 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    Limitations House hold activities;Lifting   How long can you sit comfortably? N/A    How long can you stand  comfortably? N/A    How long can you walk comfortably? N/A    Diagnostic tests Nothing post op   Currently in Pain? Yes   Pain Score 1    Pain Location Neck   Pain Orientation Left   Pain Descriptors / Indicators Aching   Pain Type Chronic pain   Pain Onset More than a month ago   Aggravating Factors  letting it stiffen up    Pain Relieving Factors stretches    Multiple Pain Sites No                         [facility] Adult PT Treatment/Exercise - 11/21/16 0001      Neck Exercises: Seated   Other Seated Exercise chin tucks x 5 hold 10 seconds each   Other Seated Exercise UBE 2 min forward/ 2 miin back to imincrease endurance      Shoulder Exercises: Supine   Other Supine Exercises supine ER; supine abduction Supine flexion with abduction green band 2x10     Shoulder Exercises: Standing   Extension Limitations 2x10 red   Row Limitations 2x10 red   Other Standing Exercises triceps extension improved today      Manual Therapy   Manual therapy comments sub-occipital release, upper trap release; soft tissue mobilization to paraspinals. Iastym with focus on left upper trap. IASTMY to bilateral upper traps to reduce spasming.       Neck Exercises: Stretches   Levator Stretch 2 reps;20 seconds  PT Education - 11/21/16 [phone]    Education provided Yes   Education Details continue with exercises    Person(s) Educated Patient   Methods Explanation;Demonstration;Verbal cues   Comprehension Verbalized understanding;Returned demonstration          PT Short Term Goals - 11/21/16 1308      PT SHORT TERM GOAL #1   Title Patient will report 2/10 pain at worst in the cervical spine    Baseline Patient stated sometimes he is at a 2/10 pain. That is goes between a 2 and 3/10.   Time 4   Period Weeks   Status Achieved     PT SHORT TERM GOAL #2   Title Patient will report decreased numbness into his hand    Baseline no change in numbness    Time 4    Period Weeks   Status On-going     PT SHORT TERM GOAL #3   Title Patient will report decreased tenderness to palpation in upper traps    Baseline continued spasming and decrease in pain   Time 4   Period Weeks   Status On-going     PT SHORT TERM GOAL #4   Title Patient will increase cervical flexion, extensin, and bilateral rotation by 20 degrees   Baseline improving    Time 4   Period Weeks   Status On-going     PT SHORT TERM GOAL #5   Title Patient will be independent with HEP    Baseline Able to perform chin tucks and levator stretch with min cues.    Time 4   Period Weeks   Status On-going           PT Long Term Goals - 10/25/16 1004      PT LONG TERM GOAL #1   Title Patient will return to the gym without increased pain in order to improve strength    Time 8   Period Weeks   Status New     PT LONG TERM GOAL #2   Title Patient will report no pain when lifting items in order to return to work if cleared by the surgeon    Time 8   Period Weeks   Status New     PT LONG TERM GOAL #3   Title Patient will demsotrate a 39% limitation on FOTO    Time 8   Period Weeks   Status New               Plan - 11/21/16 1306    Clinical Impression Statement Began to progress his exercises again today. Patient reported no increase in pain. He had improved movement with treatment. He was afdvised to slpwly start working on exercises again. Patient required continued cuing for ther-ex.    Rehab Potential Good   PT Frequency 2x / week   PT Duration 8 weeks   PT Treatment/Interventions ADLs/Self Care Home Management;Cryotherapy;Electrical Stimulation;Iontophoresis 4mg /ml Dexamethasone;Moist Heat;Ultrasound;Gait training;Stair training;Patient/family education;Neuromuscular re-education;Therapeutic exercise;Therapeutic activities;Manual techniques;Passive range of motion;Taping;Dry needling   PT Next Visit Plan re-assess reaction to last treatment, add scapular  stabilization exercises to HEP if tolerated well. Continue to progress as tolerated; contemplate e-stim for pain if needed; instruct patient on proper technique on fitness machines;   PT Home Exercise Plan cervical rotation, upper trap trigger point release, scap retraction, shoulder extension; cervical stretches from cabinet (upper trap stretch, levator stretch, corner stretch, chin tuck)   Consulted and Agree with Plan of Care Patient  Patient will benefit from skilled therapeutic intervention in order to improve the following deficits and impairments:  Decreased activity tolerance, Decreased strength, Impaired sensation, Postural dysfunction, Impaired UE functional use, Increased muscle spasms, Increased fascial restricitons  Visit Diagnosis: Cervicalgia  Other muscle spasm     Problem List Patient Active Problem List   Diagnosis Date Noted  . 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 Cervical spine fracture (HCC) 06/05/2016  . C7 cervical fracture (HCC) 06/05/2016    Dessie Comaavid J Michah Minton  PT DPT  11/21/2016, 1:11 PM  [facility] [address] [place], [place], [address] Phone: [phone]   Fax:  [phone]  Name: Malik Hernandez MRN: [id number] Date of Birth: Apr 15, 1976

## 2016-11-22 ENCOUNTER — Ambulatory Visit: Payer: 59 | Admitting: Physical Therapy

## 2016-11-22 ENCOUNTER — Encounter: Payer: Self-pay | Admitting: Physical Therapy

## 2016-11-22 DIAGNOSIS — M62838 Other muscle spasm: Secondary | ICD-10-CM

## 2016-11-22 DIAGNOSIS — M542 Cervicalgia: Secondary | ICD-10-CM | POA: Diagnosis not present

## 2016-11-22 NOTE — Therapy (Signed)
[facility] [address] [place], [place], [address] Phone: [phone]   Fax:  [phone]  Physical Therapy Treatment  Patient Details  Name: Malik Hernandez MRN: [id number] Date of Birth: 25-Dec-1975 Referring Provider: Dr Sharlet Salina Dity   Encounter Date: 11/22/2016      PT End of Session - 11/22/16 0936    Visit Number 9   Number of Visits 16   Date for PT Re-Evaluation 12/20/16   Authorization Type BCBS    PT Start Time 0930   PT Stop Time 1014   PT Time Calculation (min) 44 min   Activity Tolerance Patient tolerated treatment well   Behavior During Therapy [facility] for tasks assessed/performed      Past Medical History:  Diagnosis Date  . Headache     Past Surgical History:  Procedure Laterality Date  . ANTERIOR CERVICAL DECOMP/DISCECTOMY FUSION N/A 07/11/2016   Procedure: Cervical six - Cervical Seven  Anterior cervical decompression/discectomy/fusion;  Surgeon: Loura Halt Ditty, MD;  Location: [facility] OR;  Service: Neurosurgery;  Laterality: N/A;  Cervical six - Cervical Seven  Anterior cervical decompression/discectomy/fusion  . CERVICAL FUSION    . MYRINGOTOMY WITH TUBE PLACEMENT    . POSTERIOR CERVICAL FUSION/FORAMINOTOMY N/A 06/05/2016   Procedure: LEFT CERVICAL SIX-SEVEN LAMINOTOMY, CERVICAL SIX-SEVEN DECOMPRESSIVE LAMINECTOMY, CERVICAL FIVE-SIX, CERVICAL SIX-SEVEN, CERVICAL SEVEN-THORACIC ONE DORSAL FUSION;  Surgeon: Loura Halt Ditty, MD;  Location: [facility] OR;  Service: Neurosurgery;  Laterality: N/A;    There were no vitals filed for this visit.      Subjective Assessment - 11/22/16 0935    Subjective Patient reports no increase in pain after last visit but he is having increased numbness into his hand. The numbness began yesterday.    Limitations House hold activities;Lifting   How long can you sit comfortably? N/A    How long can you stand comfortably? N/A    How long can you walk comfortably? N/A    Diagnostic tests Nothing post op   Patient Stated Goals to go back to work   Currently in Pain? Yes   Pain Score 3    Pain Location Neck   Pain Orientation Left   Pain Descriptors / Indicators Aching   Pain Type Chronic pain   Pain Onset More than a month ago   Aggravating Factors  stiffness    Pain Relieving Factors stretches                          [facility] Adult PT Treatment/Exercise - 11/22/16 0001      Manual Therapy   Manual therapy comments sub-occipital release, upper trap release; soft tissue mobilization to paraspinals. Iastym with focus on left upper trap. IASTMY to bilateral upper traps to reduce spasming.  Light PA mobs above and below the surgical fixation site; light thoracic mobilizations with breathing to improve motion. IASTYM to bilateral upper trapis and into the paraspinals                 PT Education - 11/22/16 1053    Education provided Yes   Education Details educated on exercises and symptom management over the weekend    Person(s) Educated Patient   Methods Explanation;Demonstration;Verbal cues   Comprehension Verbalized understanding;Returned demonstration          PT Short Term Goals - 11/22/16 1156      PT SHORT TERM GOAL #1   Title Patient will report 2/10 pain at worst in the  cervical spine    Baseline Patient stated sometimes he is at a 2/10 pain. That is goes between a 2 and 3/10.   Time 4   Period Weeks   Status Achieved     PT SHORT TERM GOAL #2   Title Patient will report decreased numbness into his hand    Baseline no change in numbness    Time 4   Period Weeks   Status On-going     PT SHORT TERM GOAL #3   Title Patient will report decreased tenderness to palpation in upper traps    Baseline continued spasming and decrease in pain   Time 4   Period Weeks   Status On-going     PT SHORT TERM GOAL #4   Title Patient will increase cervical flexion, extensin, and bilateral rotation by 20 degrees   Baseline  improving    Time 4   Period Weeks   Status On-going     PT SHORT TERM GOAL #5   Title Patient will be independent with HEP    Baseline Able to perform chin tucks and levator stretch with min cues.    Time 4   Period Weeks   Status On-going           PT Long Term Goals - 10/25/16 1004      PT LONG TERM GOAL #1   Title Patient will return to the gym without increased pain in order to improve strength    Time 8   Period Weeks   Status New     PT LONG TERM GOAL #2   Title Patient will report no pain when lifting items in order to return to work if cleared by the surgeon    Time 8   Period Weeks   Status New     PT LONG TERM GOAL #3   Title Patient will demsotrate a 39% limitation on FOTO    Time 8   Period Weeks   Status New               Plan - 11/22/16 4098    Clinical Impression Statement Therapy focused on manualtherapy today. Therapy tried [employer]. He had less pain with movement but continued to have limited movement; Therapy also worked on light PA mobilizations above and below the surgical fixation. He reported improved numbness after mobilizations and IASTYM. He was advised to hold off the exercises if the numbness stays down. If his numbness returns do his lighter exercises.    Rehab Potential Good   PT Frequency 2x / week   PT Duration 6 weeks   PT Treatment/Interventions ADLs/Self Care Home Management;Cryotherapy;Electrical Stimulation;Iontophoresis 4mg /ml Dexamethasone;Moist Heat;Ultrasound;Gait training;Stair training;Patient/family education;Neuromuscular re-education;Therapeutic exercise;Therapeutic activities;Manual techniques;Passive range of motion;Taping;Dry needling   PT Next Visit Plan re-assess reaction to last treatment, add scapular stabilization exercises to HEP if tolerated well. Continue to progress as tolerated; contemplate e-stim for pain if needed; instruct patient on proper technique on fitness machines;   PT Home  Exercise Plan cervical rotation, upper trap trigger point release, scap retraction, shoulder extension; cervical stretches from cabinet (upper trap stretch, levator stretch, corner stretch, chin tuck)   Consulted and Agree with Plan of Care Patient      Patient will benefit from skilled therapeutic intervention in order to improve the following deficits and impairments:  Decreased activity tolerance, Decreased strength, Impaired sensation, Postural dysfunction, Impaired UE functional use, Increased muscle spasms, Increased fascial restricitons  Visit Diagnosis: Cervicalgia  Other muscle spasm  Problem List Patient Active Problem List   Diagnosis Date Noted  . 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 Cervical spine fracture (HCC) 06/05/2016  . C7 cervical fracture (HCC) 06/05/2016    Dessie Comaavid J Nella Botsford PT DPT 11/22/2016, 12:00 PM  [facility] [address] [place], [place], [address] Phone: [phone]   Fax:  [phone]  Name: Malik Hernandez MRN: [id number] Date of Birth: May 15, 1976

## 2016-11-27 ENCOUNTER — Ambulatory Visit: Payer: 59 | Admitting: Physical Therapy

## 2016-11-29 ENCOUNTER — Ambulatory Visit: Payer: 59 | Admitting: Physical Therapy

## 2016-11-29 DIAGNOSIS — M542 Cervicalgia: Secondary | ICD-10-CM | POA: Diagnosis not present

## 2016-11-29 DIAGNOSIS — M62838 Other muscle spasm: Secondary | ICD-10-CM

## 2016-11-29 NOTE — Therapy (Signed)
[facility] [address] [place], [place], [address] Phone: [phone]   Fax:  [phone]  Physical Therapy Treatment  Patient Details  Name: Malik Hernandez MRN: [id number] Date of Birth: Nov 28, 1975 Referring Provider: Dr Sharlet Salina Dity   Encounter Date: 11/29/2016      PT End of Session - 11/29/16 0958    Visit Number 10   Number of Visits 16   Date for PT Re-Evaluation 12/20/16   Authorization Type BCBS    PT Start Time 0931   PT Stop Time 1025   PT Time Calculation (min) 54 min   Activity Tolerance Patient tolerated treatment well   Behavior During Therapy [facility] for tasks assessed/performed      Past Medical History:  Diagnosis Date  . Headache     Past Surgical History:  Procedure Laterality Date  . ANTERIOR CERVICAL DECOMP/DISCECTOMY FUSION N/A 07/11/2016   Procedure: Cervical six - Cervical Seven  Anterior cervical decompression/discectomy/fusion;  Surgeon: Loura Halt Ditty, MD;  Location: [facility] OR;  Service: Neurosurgery;  Laterality: N/A;  Cervical six - Cervical Seven  Anterior cervical decompression/discectomy/fusion  . CERVICAL FUSION    . MYRINGOTOMY WITH TUBE PLACEMENT    . POSTERIOR CERVICAL FUSION/FORAMINOTOMY N/A 06/05/2016   Procedure: LEFT CERVICAL SIX-SEVEN LAMINOTOMY, CERVICAL SIX-SEVEN DECOMPRESSIVE LAMINECTOMY, CERVICAL FIVE-SIX, CERVICAL SIX-SEVEN, CERVICAL SEVEN-THORACIC ONE DORSAL FUSION;  Surgeon: Loura Halt Ditty, MD;  Location: [facility] OR;  Service: Neurosurgery;  Laterality: N/A;    There were no vitals filed for this visit.      Subjective Assessment - 11/29/16 0956    Subjective Patient reports his pain has been better. He is still very stiff but he feels like that is better as well.    Limitations House hold activities;Lifting   How long can you sit comfortably? N/A    How long can you stand comfortably? N/A    How long can you walk comfortably? N/A    Diagnostic tests Nothing post op    Patient Stated Goals to go back to work   Currently in Pain? Yes   Pain Score 4    Pain Location Neck   Pain Orientation Left   Pain Descriptors / Indicators Aching   Pain Type Chronic pain   Pain Onset More than a month ago   Pain Frequency Constant   Aggravating Factors  always stiff    Pain Relieving Factors stretches    Multiple Pain Sites No                         [facility] Adult PT Treatment/Exercise - 11/29/16 0001      Neck Exercises: Seated   Other Seated Exercise chin tucks x 5 hold 10 seconds each   Other Seated Exercise UBE 2 min forward/ 2 miin back to imincrease endurance      Shoulder Exercises: Supine   Other Supine Exercises ; supine abduction Supine flexion with abduction green band 2x10     Shoulder Exercises: Standing   Extension Limitations 2x10 green    Row Limitations 2x10 green   Other Standing Exercises triceps extension red 2x10  improved today      Moist Heat Therapy   Number Minutes Moist Heat 10 Minutes   Moist Heat Location Cervical     Manual Therapy   Manual therapy comments sub-occipital release, upper trap release; soft tissue mobilization to paraspinals. Iastym with focus on left upper trap. IASTMY to bilateral upper traps to reduce  spasming.  Light PA mobs above and below the surgical fixation site; light thoracic mobilizations with breathing to improve motion. IASTYM to bilateral upper trapis and into the paraspinals      Neck Exercises: Stretches   Levator Stretch 2 reps;20 seconds                PT Education - 11/29/16 0958    Education provided Yes   Education Details improtance of stretching and strengthening    Person(s) Educated Patient   Methods Explanation;Demonstration;Tactile cues;Verbal cues   Comprehension Verbalized understanding;Returned demonstration          PT Short Term Goals - 11/22/16 1156      PT SHORT TERM GOAL #1   Title Patient will report 2/10 pain at worst in the cervical  spine    Baseline Patient stated sometimes he is at a 2/10 pain. That is goes between a 2 and 3/10.   Time 4   Period Weeks   Status Achieved     PT SHORT TERM GOAL #2   Title Patient will report decreased numbness into his hand    Baseline no change in numbness    Time 4   Period Weeks   Status On-going     PT SHORT TERM GOAL #3   Title Patient will report decreased tenderness to palpation in upper traps    Baseline continued spasming and decrease in pain   Time 4   Period Weeks   Status On-going     PT SHORT TERM GOAL #4   Title Patient will increase cervical flexion, extensin, and bilateral rotation by 20 degrees   Baseline improving    Time 4   Period Weeks   Status On-going     PT SHORT TERM GOAL #5   Title Patient will be independent with HEP    Baseline Able to perform chin tucks and levator stretch with min cues.    Time 4   Period Weeks   Status On-going           PT Long Term Goals - 10/25/16 1004      PT LONG TERM GOAL #1   Title Patient will return to the gym without increased pain in order to improve strength    Time 8   Period Weeks   Status New     PT LONG TERM GOAL #2   Title Patient will report no pain when lifting items in order to return to work if cleared by the surgeon    Time 8   Period Weeks   Status New     PT LONG TERM GOAL #3   Title Patient will demsotrate a 39% limitation on FOTO    Time 8   Period Weeks   Status New               Plan - 11/29/16 1000    Clinical Impression Statement Therapy continued to work on light mobilization and exercises. He appears to be gaining some cervical rotation but he is still very limited. His grip on the left was measured at 65 compared to 115 on the right. Therapy will ocntinue toporess strengthening as tolerated. Improved tricpes extension noted today.    Rehab Potential Good   PT Frequency 2x / week   PT Duration 8 weeks   PT Treatment/Interventions ADLs/Self Care Home  Management;Cryotherapy;Electrical Stimulation;Iontophoresis 4mg /ml Dexamethasone;Moist Heat;Ultrasound;Gait training;Stair training;Patient/family education;Neuromuscular re-education;Therapeutic exercise;Therapeutic activities;Manual techniques;Passive range of motion;Taping;Dry needling   PT Next Visit Plan  re-assess reaction to last treatment, add scapular stabilization exercises to HEP if tolerated well. Continue to progress as tolerated; contemplate e-stim for pain if needed; instruct patient on proper technique on fitness machines;   PT Home Exercise Plan cervical rotation, upper trap trigger point release, scap retraction, shoulder extension; cervical stretches from cabinet (upper trap stretch, levator stretch, corner stretch, chin tuck)   Consulted and Agree with Plan of Care Patient      Patient will benefit from skilled therapeutic intervention in order to improve the following deficits and impairments:  Decreased activity tolerance, Decreased strength, Impaired sensation, Postural dysfunction, Impaired UE functional use, Increased muscle spasms, Increased fascial restricitons  Visit Diagnosis: Cervicalgia  Other muscle spasm     Problem List Patient Active Problem List   Diagnosis Date Noted  . 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 Cervical spine fracture (H[facility]) 06/05/2016  . C7 cervical fracture (HCC) 06/05/2016    Dessie Comaavid J Rupal Childress  PT DPT  11/29/2016, 10:43 AM  [facility] [address] [place], [place], [address] Phone: [phone]   Fax:  [phone]  Name: [place]had E Hernandez MRN: [id number] Date of Birth: 03/28/1976

## 2016-12-03 ENCOUNTER — Ambulatory Visit: Payer: 59 | Admitting: Physical Therapy

## 2016-12-05 ENCOUNTER — Ambulatory Visit: Payer: 59 | Admitting: Physical Therapy

## 2016-12-05 DIAGNOSIS — M542 Cervicalgia: Secondary | ICD-10-CM | POA: Diagnosis not present

## 2016-12-05 DIAGNOSIS — M62838 Other muscle spasm: Secondary | ICD-10-CM | POA: Diagnosis not present

## 2016-12-05 NOTE — Therapy (Signed)
[place], [place], [address] Phone: [phone]   Fax:  [phone]  Physical Therapy Treatment  Patient Details  Name: Malik Hernandez MRN: [id number] Date of Birth: 04-19-1976 Referring Provider: Dr Marland Kitchen Dity   Encounter Date: 12/05/2016      PT End of Session - 12/05/16 0938    Visit Number 11   Number of Visits 16   Date for PT Re-Evaluation 12/20/16   Authorization Type BCBS    PT Start Time 0930   PT Stop Time 1025   PT Time Calculation (min) 55 min      Past Medical History:  Diagnosis Date  . Headache     Past Surgical History:  Procedure Laterality Date  . ANTERIOR CERVICAL DECOMP/DISCECTOMY FUSION N/A 07/11/2016   Procedure: Cervical six - Cervical Seven  Anterior cervical decompression/discectomy/fusion;  Surgeon: Kevan Ny Ditty, MD;  Location: [place];  Service: Neurosurgery;  Laterality: N/A;  Cervical six - Cervical Seven  Anterior cervical decompression/discectomy/fusion  . CERVICAL FUSION    . MYRINGOTOMY WITH TUBE PLACEMENT    . POSTERIOR CERVICAL FUSION/FORAMINOTOMY N/A 06/05/2016   Procedure: LEFT CERVICAL SIX-SEVEN LAMINOTOMY, CERVICAL SIX-SEVEN DECOMPRESSIVE LAMINECTOMY, CERVICAL FIVE-SIX, CERVICAL SIX-SEVEN, CERVICAL SEVEN-THORACIC ONE DORSAL FUSION;  Surgeon: Kevan Ny Ditty, MD;  Location: [place];  Service: Neurosurgery;  Laterality: N/A;    There were no vitals filed for this visit.      Subjective Assessment - 12/05/16 1017    Subjective My neck is hurting more today, maybe because I have been sick.    Currently in Pain? Yes   Pain Score 6    Pain Location Neck   Pain Orientation Left   Pain Descriptors / Indicators Aching   Aggravating Factors  sitting up without support    Pain Relieving Factors leaning head against something             [facility] PT Assessment - 12/05/16 0001      Observation/Other Assessments   Focus on Therapeutic Outcomes  (FOTO)  60% limited, worse than 54% limited on initial eval      AROM   Cervical Flexion 30   Cervical Extension 25   Cervical - Right Side Bend 25   Cervical - Left Side Bend 25   Cervical - Right Rotation 30   Cervical - Left Rotation 28                     [facility] Adult PT Treatment/Exercise - 12/05/16 0001      Neck Exercises: Seated   Other Seated Exercise chin tucks x 5 hold 10 seconds each   Other Seated Exercise UBE 2 min forward/ 2 miin back to imincrease endurance      Shoulder Exercises: Supine   Other Supine Exercises Supine scap bands with green band horizontal abduction, ER, pullovers with tension, sash x 20 each      Modalities   Modalities Electrical Stimulation     Moist Heat Therapy   Number Minutes Moist Heat 15 Minutes   Moist Heat Location Cervical     Electrical Stimulation   Electrical Stimulation Location Lower cervical and upper traps   Electrical Stimulation Action IFC   Electrical Stimulation Parameters 16 ma   Electrical Stimulation Goals Pain     Manual Therapy   Manual therapy comments sub-occipital release, upper trap release; soft tissue mobilization to paraspinals. Iastym with focus on left upper trap. IASTMY to bilateral upper traps  to reduce spasming.  Light PA mobs above and below the surgical fixation site; light thoracic mobilizations with breathing to improve motion. IASTYM to bilateral upper trapis and into the paraspinals                   PT Short Term Goals - 12/05/16 1035      PT SHORT TERM GOAL #1   Title Patient will report 2/10 pain at worst in the cervical spine    Baseline up to 6/10   Time 4   Period Weeks   Status On-going     PT SHORT TERM GOAL #2   Title Patient will report decreased numbness into his hand    Baseline no change in numbness    Time 4   Period Weeks   Status On-going     PT SHORT TERM GOAL #3   Title Patient will report decreased tenderness to palpation in upper traps     Baseline continued spasming and decrease in pain   Time 4   Period Weeks   Status On-going     PT SHORT TERM GOAL #4   Title Patient will increase cervical flexion, extensin, and bilateral rotation by 20 degrees   Baseline all improved at least 10 degrees, not 20 degrees yet    Time 4   Period Weeks   Status On-going     PT SHORT TERM GOAL #5   Title Patient will be independent with HEP    Time 4   Period Weeks   Status Achieved           PT Long Term Goals - 12/05/16 1037      PT LONG TERM GOAL #1   Title Patient will return to the gym without increased pain in order to improve strength    Baseline unable to return to gym    Time 8   Period Weeks   Status Unable to assess     PT LONG TERM GOAL #2   Title Patient will report no pain when lifting items in order to return to work if cleared by the surgeon    Baseline he can lift grocerries, light weight items   Time 8   Period Weeks   Status Partially Met     PT LONG TERM GOAL #3   Title Patient will demsotrate a 39% limitation on FOTO    Baseline 60% limited status 12/05/2016   Time 8   Period Weeks   Status On-going               Plan - 12/05/16 1019    Clinical Impression Statement Pt is unable to sit up straight without resting head back against wall/chair. He has questions about why his neck is still too weak to hold up without assistance. He performs HEP 3 x per day and neck and arms feel exhausted afterward. he does feel like he is gaining some strength in his arms. STG3 5 met, LTG# 2 partially met. Trial of IFC today lower cervical spine and upper traps. Pain decreased from 6/10 to 4/10 after treatment, AROM improved.    PT Next Visit Plan assess response to EStim, Continue to progress as tolerated;  instruct patient on proper technique on fitness machines;    PT Home Exercise Plan cervical rotation, upper trap trigger point release, scap retraction, shoulder extension; cervical stretches from cabinet  (upper trap stretch, levator stretch, corner stretch, chin tuck)   Consulted and Agree with Plan of  Care Patient      Patient will benefit from skilled therapeutic intervention in order to improve the following deficits and impairments:  Decreased activity tolerance, Decreased strength, Impaired sensation, Postural dysfunction, Impaired UE functional use, Increased muscle spasms, Increased fascial restricitons  Visit Diagnosis: Cervicalgia  Other muscle spasm     Problem List Patient Active Problem List   Diagnosis Date Noted  . 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 Cervical spine fracture ([place]) 06/05/2016  . C7 cervical fracture ([place]) 06/05/2016    Dorene Ar, PTA 12/05/2016, 10:41 AM  [place], [place], [address] Phone: [phone]   Fax:  [phone]  Name: Malik Hernandez MRN: [id number] Date of Birth: January 28, 1976

## 2016-12-09 ENCOUNTER — Ambulatory Visit: Payer: 59 | Admitting: Physical Therapy

## 2016-12-09 DIAGNOSIS — M62838 Other muscle spasm: Secondary | ICD-10-CM | POA: Diagnosis not present

## 2016-12-09 DIAGNOSIS — M542 Cervicalgia: Secondary | ICD-10-CM | POA: Diagnosis not present

## 2016-12-09 NOTE — Therapy (Signed)
[place], [place], [address] Phone: [phone]   Fax:  [phone]  Physical Therapy Treatment  Patient Details  Name: Malik Hernandez MRN: [id number] Date of Birth: [birth year] Referring Provider: Dr Marland Kitchen Dity   Encounter Date: 12/09/2016      PT End of Session - 12/09/16 0928    Visit Number 12   Number of Visits 16   Date for PT Re-Evaluation 12/20/16   Authorization Type BCBS    PT Start Time 0926   PT Stop Time 1030   PT Time Calculation (min) 64 min      Past Medical History:  Diagnosis Date  . Headache     Past Surgical History:  Procedure Laterality Date  . ANTERIOR CERVICAL DECOMP/DISCECTOMY FUSION N/A 07/11/2016   Procedure: Cervical six - Cervical Seven  Anterior cervical decompression/discectomy/fusion;  Surgeon: Kevan Ny Ditty, MD;  Location: [place];  Service: Neurosurgery;  Laterality: N/A;  Cervical six - Cervical Seven  Anterior cervical decompression/discectomy/fusion  . CERVICAL FUSION    . MYRINGOTOMY WITH TUBE PLACEMENT    . POSTERIOR CERVICAL FUSION/FORAMINOTOMY N/A 06/05/2016   Procedure: LEFT CERVICAL SIX-SEVEN LAMINOTOMY, CERVICAL SIX-SEVEN DECOMPRESSIVE LAMINECTOMY, CERVICAL FIVE-SIX, CERVICAL SIX-SEVEN, CERVICAL SEVEN-THORACIC ONE DORSAL FUSION;  Surgeon: Kevan Ny Ditty, MD;  Location: [place];  Service: Neurosurgery;  Laterality: N/A;    There were no vitals filed for this visit.      Subjective Assessment - 12/09/16 0928    Subjective My neck is aching today, it's not real bad pain.     Currently in Pain? Yes   Pain Score 5    Pain Location Neck   Pain Orientation Left   Pain Descriptors / Indicators Aching                         [facility] Adult PT Treatment/Exercise - 12/09/16 0001      Neck Exercises: Supine   Neck Retraction 10 reps   Other Supine Exercise supine chin tuck with head lift x 10      Neck Exercises: Prone   Neck  Retraction 10 reps   Neck Retraction Limitations prone on elbows    W Back 10 reps   Other Prone Exercise horizontal abduction    Other Prone Exercise Quadruped chin tuck x 10, then alternating UE lifs x 10      Shoulder Exercises: Standing   Horizontal ABduction 20 reps  chin tuck into towel at wall    Theraband Level (Shoulder Horizontal ABduction) Level 3 (Green)   External Rotation 20 reps  chin tuck into towel at wall    Theraband Level (Shoulder External Rotation) Level 3 (Green)   Flexion Limitations bilateral pullovers with green band   chin tuck into towel at wall                  PT Short Term Goals - 12/05/16 1035      PT SHORT TERM GOAL #1   Title Patient will report 2/10 pain at worst in the cervical spine    Baseline up to 6/10   Time 4   Period Weeks   Status On-going     PT SHORT TERM GOAL #2   Title Patient will report decreased numbness into his hand    Baseline no change in numbness    Time 4   Period Weeks   Status On-going     PT SHORT TERM GOAL #  3   Title Patient will report decreased tenderness to palpation in upper traps    Baseline continued spasming and decrease in pain   Time 4   Period Weeks   Status On-going     PT SHORT TERM GOAL #4   Title Patient will increase cervical flexion, extensin, and bilateral rotation by 20 degrees   Baseline all improved at least 10 degrees, not 20 degrees yet    Time 4   Period Weeks   Status On-going     PT SHORT TERM GOAL #5   Title Patient will be independent with HEP    Time 4   Period Weeks   Status Achieved           PT Long Term Goals - 12/05/16 1037      PT LONG TERM GOAL #1   Title Patient will return to the gym without increased pain in order to improve strength    Baseline unable to return to gym    Time 8   Period Weeks   Status Unable to assess     PT LONG TERM GOAL #2   Title Patient will report no pain when lifting items in order to return to work if cleared by the  surgeon    Baseline he can lift grocerries, light weight items   Time 8   Period Weeks   Status Partially Met     PT LONG TERM GOAL #3   Title Patient will demsotrate a 39% limitation on FOTO    Baseline 60% limited status 12/05/2016   Time 8   Period Weeks   Status On-going               Plan - 12/09/16 0929    Clinical Impression Statement Focused stabilization exercises. Tried prone exercises for neck stabilization. Pt tolerated treatment well. Repeated prone soft tissue work and Estim as patient felt this was helpful last visit.    PT Next Visit Plan Estim PRN,  Continue to progress as tolerated;  instruct patient on proper technique on fitness machines;    PT Home Exercise Plan cervical rotation, upper trap trigger point release, scap retraction, shoulder extension; cervical stretches from cabinet (upper trap stretch, levator stretch, corner stretch, chin tuck)   Consulted and Agree with Plan of Care Patient      Patient will benefit from skilled therapeutic intervention in order to improve the following deficits and impairments:  Decreased activity tolerance, Decreased strength, Impaired sensation, Postural dysfunction, Impaired UE functional use, Increased muscle spasms, Increased fascial restricitons  Visit Diagnosis: Cervicalgia  Other muscle spasm     Problem List Patient Active Problem List   Diagnosis Date Noted  . 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 Cervical spine fracture ([place]) 06/05/2016  . C7 cervical fracture ([place]) 06/05/2016    Dorene Ar, PTA 12/09/2016, 10:48 AM  [place], [place], [address] Phone: [phone]   Fax:  [phone]  Name: Malik Hernandez MRN: [id number] Date of Birth: 01-28-1976

## 2016-12-12 ENCOUNTER — Ambulatory Visit: Payer: 59 | Admitting: Physical Therapy

## 2016-12-12 DIAGNOSIS — M542 Cervicalgia: Secondary | ICD-10-CM

## 2016-12-12 DIAGNOSIS — M62838 Other muscle spasm: Secondary | ICD-10-CM | POA: Diagnosis not present

## 2016-12-12 NOTE — Therapy (Signed)
[place], [place], [address] Phone: [phone]   Fax:  [phone]  Physical Therapy Treatment  Patient Details  Name: Malik Hernandez MRN: [id number] Date of Birth: September 29, 1975 Referring Provider: Dr Marland Kitchen Dity   Encounter Date: 12/12/2016      PT End of Session - 12/12/16 0952    Visit Number 13   Number of Visits 16   Date for PT Re-Evaluation 12/20/16   Authorization Type BCBS    PT Start Time 0932   PT Stop Time 1014   PT Time Calculation (min) 42 min   Activity Tolerance Patient tolerated treatment well   Behavior During Therapy [facility] for tasks assessed/performed      Past Medical History:  Diagnosis Date  . Headache     Past Surgical History:  Procedure Laterality Date  . ANTERIOR CERVICAL DECOMP/DISCECTOMY FUSION N/A 07/11/2016   Procedure: Cervical six - Cervical Seven  Anterior cervical decompression/discectomy/fusion;  Surgeon: Kevan Ny Ditty, MD;  Location: [place];  Service: Neurosurgery;  Laterality: N/A;  Cervical six - Cervical Seven  Anterior cervical decompression/discectomy/fusion  . CERVICAL FUSION    . MYRINGOTOMY WITH TUBE PLACEMENT    . POSTERIOR CERVICAL FUSION/FORAMINOTOMY N/A 06/05/2016   Procedure: LEFT CERVICAL SIX-SEVEN LAMINOTOMY, CERVICAL SIX-SEVEN DECOMPRESSIVE LAMINECTOMY, CERVICAL FIVE-SIX, CERVICAL SIX-SEVEN, CERVICAL SEVEN-THORACIC ONE DORSAL FUSION;  Surgeon: Kevan Ny Ditty, MD;  Location: [place];  Service: Neurosurgery;  Laterality: N/A;    There were no vitals filed for this visit.      Subjective Assessment - 12/12/16 0936    Subjective Patient continues to have stiffness in his neck. He diesnt feel like it is getting any better. He felt like it had lossend up a bit but now it is back to being stiffn gain. He can not rememebr doing anything out of the ordinary ofer the weekend. He held off on his exerciises thinking it may have been them but it  made no difference.    Limitations House hold activities;Lifting   How long can you sit comfortably? N/A    How long can you stand comfortably? N/A    Currently in Pain? Yes   Pain Score 5    Pain Location Neck   Pain Orientation Left   Pain Descriptors / Indicators Aching   Pain Type Chronic pain   Pain Onset More than a month ago   Pain Frequency Constant   Aggravating Factors  carrying items    Pain Relieving Factors Leaning head  agianst something    Multiple Pain Sites No                         [facility] Adult PT Treatment/Exercise - 12/12/16 0001      Neck Exercises: Seated   Other Seated Exercise UBE 2.5 min forward/ 2.5 miin back to imincrease endurance      Neck Exercises: Supine   Neck Retraction 10 reps     Shoulder Exercises: Standing   Horizontal ABduction 20 reps  chin tuck into towel at wall    Theraband Level (Shoulder Horizontal ABduction) Level 3 (Green)   External Rotation 20 reps  chin tuck into towel at wall    Theraband Level (Shoulder External Rotation) Level 3 (Green)     Manual Therapy   Manual therapy comments sub-occipital release, upper trap release; soft tissue mobilization to paraspinals. Iastym with focus on left upper trap. IASTMY to bilateral upper traps to reduce  spasming.  Light PA mobs above and below the surgical fixation site; light thoracic mobilizations with breathing to improve motion. IASTYM to bilateral upper trapis and into the paraspinals      Neck Exercises: Stretches   Upper Trapezius Stretch Limitations 3x20second holds   Levator Stretch 3 reps;20 seconds                PT Education - 12/12/16 0951    Education provided Yes   Education Details continue stretching at home.    Person(s) Educated Patient   Methods Explanation;Demonstration;Tactile cues   Comprehension Verbalized understanding;Returned demonstration          PT Short Term Goals - 12/12/16 1001      PT SHORT TERM GOAL #1   Title  Patient will report 2/10 pain at worst in the cervical spine    Baseline up to 6/10   Time 4   Period Weeks   Status On-going     PT SHORT TERM GOAL #2   Title Patient will report decreased numbness into his hand    Baseline no change in numbness    Time 4   Period Weeks   Status On-going     PT SHORT TERM GOAL #3   Title Patient will report decreased tenderness to palpation in upper traps    Baseline continued spasming and decrease in pain   Time 4   Period Weeks   Status On-going     PT SHORT TERM GOAL #4   Title Patient will increase cervical flexion, extensin, and bilateral rotation by 20 degrees   Baseline all improved at least 10 degrees, not 20 degrees yet    Time 4   Period Weeks   Status On-going     PT SHORT TERM GOAL #5   Title Patient will be independent with HEP    Baseline Able to perform chin tucks and levator stretch with min cues.    Time 4   Period Weeks   Status Achieved           PT Long Term Goals - 12/05/16 1037      PT LONG TERM GOAL #1   Title Patient will return to the gym without increased pain in order to improve strength    Baseline unable to return to gym    Time 8   Period Weeks   Status Unable to assess     PT LONG TERM GOAL #2   Title Patient will report no pain when lifting items in order to return to work if cleared by the surgeon    Baseline he can lift grocerries, light weight items   Time 8   Period Weeks   Status Partially Met     PT LONG TERM GOAL #3   Title Patient will demsotrate a 39% limitation on FOTO    Baseline 60% limited status 12/05/2016   Time 8   Period Weeks   Status On-going               Plan - 12/12/16 0957    Clinical Impression Statement Patient has not made much progress. He had 20 degrees of right rotation today and only 15 of left even after manual therapy. Hr tolerated his exercises well. Patient will have 1 more visit next week. If he continues to have no carryover he wil likely be  discharged.    Rehab Potential Good   PT Frequency 2x / week   PT Duration 6 weeks  PT Treatment/Interventions ADLs/Self Care Home Management;Cryotherapy;Electrical Stimulation;Iontophoresis 65m/ml Dexamethasone;Moist Heat;Ultrasound;Gait training;Stair training;Patient/family education;Neuromuscular re-education;Therapeutic exercise;Therapeutic activities;Manual techniques;Passive range of motion;Taping;Dry needling   PT Next Visit Plan Estim PRN,  Continue to progress as tolerated;  instruct patient on proper technique on fitness machines;    PT Home Exercise Plan cervical rotation, upper trap trigger point release, scap retraction, shoulder extension; cervical stretches from cabinet (upper trap stretch, levator stretch, corner stretch, chin tuck)   Consulted and Agree with Plan of Care Patient      Patient will benefit from skilled therapeutic intervention in order to improve the following deficits and impairments:  Decreased activity tolerance, Decreased strength, Impaired sensation, Postural dysfunction, Impaired UE functional use, Increased muscle spasms, Increased fascial restricitons  Visit Diagnosis: Cervicalgia  Other muscle spasm     Problem List Patient Active Problem List   Diagnosis Date Noted  . 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 [place] spine fracture [place] 06/05/2016  . C[place] cervical fracture [place] 06/05/2016    DCarney LivingPT DPT  12/12/2016, 10:49 AM  [facility] [address]Phone: [phone]  Fax:  [phone] Name: CMaliE Laramee MRN: [id number] of Birth: 324-Mar-1977

## 2016-12-17 ENCOUNTER — Ambulatory Visit: Payer: 59 | Admitting: Physical Therapy

## 2016-12-17 ENCOUNTER — Encounter: Payer: Self-pay | Admitting: Physical Therapy

## 2016-12-17 DIAGNOSIS — M542 Cervicalgia: Secondary | ICD-10-CM | POA: Diagnosis not present

## 2016-12-17 DIAGNOSIS — M62838 Other muscle spasm: Secondary | ICD-10-CM

## 2016-12-17 NOTE — Therapy (Signed)
[place], [place], [address] Phone: [phone]   Fax:  [phone]  Physical Therapy Treatment / Re-certification note  Patient Details  Name: Malik Hernandez MRN: [id number] Date of Birth: April 30, 1976 Referring Provider: Dr Marland Kitchen Dity   Encounter Date: 12/17/2016      PT End of Session - 12/17/16 0939    Visit Number 14   Number of Visits 20   Date for PT Re-Evaluation 01/14/17   PT Start Time 0940  pt arrived 10 minutes late   PT Stop Time 1030   PT Time Calculation (min) 50 min   Activity Tolerance Patient tolerated treatment well   Behavior During Therapy [facility] for tasks assessed/performed      Past Medical History:  Diagnosis Date  . Headache     Past Surgical History:  Procedure Laterality Date  . ANTERIOR CERVICAL DECOMP/DISCECTOMY FUSION N/A 07/11/2016   Procedure: Cervical six - Cervical Seven  Anterior cervical decompression/discectomy/fusion;  Surgeon: Kevan Ny Ditty, MD;  Location: [place];  Service: Neurosurgery;  Laterality: N/A;  Cervical six - Cervical Seven  Anterior cervical decompression/discectomy/fusion  . CERVICAL FUSION    . MYRINGOTOMY WITH TUBE PLACEMENT    . POSTERIOR CERVICAL FUSION/FORAMINOTOMY N/A 06/05/2016   Procedure: LEFT CERVICAL SIX-SEVEN LAMINOTOMY, CERVICAL SIX-SEVEN DECOMPRESSIVE LAMINECTOMY, CERVICAL FIVE-SIX, CERVICAL SIX-SEVEN, CERVICAL SEVEN-THORACIC ONE DORSAL FUSION;  Surgeon: Kevan Ny Ditty, MD;  Location: [place];  Service: Neurosurgery;  Laterality: N/A;    There were no vitals filed for this visit.      Subjective Assessment - 12/17/16 0939    Subjective "it still feels tight in the same spot"    Currently in Pain? Yes   Pain Score 5    Pain Location Neck   Pain Orientation Left   Pain Descriptors / Indicators Aching;Sore   Pain Type Chronic pain   Pain Onset More than a month ago   Pain Frequency Constant   Aggravating Factors   picking up items   Pain Relieving Factors sitting, heat sometime.             [facility] PT Assessment - 12/17/16 0943      AROM   Cervical Flexion 11   Cervical Extension 40   Cervical - Right Side Bend 22   Cervical - Left Side Bend 21   Cervical - Right Rotation 32   Cervical - Left Rotation 29                     [facility] Adult PT Treatment/Exercise - 12/17/16 1003      Moist Heat Therapy   Number Minutes Moist Heat 10 Minutes   Moist Heat Location Cervical     Electrical Stimulation   Electrical Stimulation Location Lower cervical and upper traps     Manual Therapy   Manual Therapy Soft tissue mobilization;Taping   Soft tissue mobilization IASTM over the L upper trap   [place] L upper trap inhibition taping     Neck Exercises: Stretches   Upper Trapezius Stretch Limitations 3x20second holds   Levator Stretch 3 reps;20 seconds          Trigger Point Dry Needling - 12/17/16 0950    Consent Given? Yes   Education Handout Provided Yes   Muscles Treated Upper Body Oblique capitus;Upper trapezius;Suboccipitals muscle group   Upper Trapezius Response Twitch reponse elicited;Palpable increased muscle length  L   Oblique Capitus Response Palpable increased muscle length;Twitch response elicited  L   SubOccipitals Response Twitch response elicited;Palpable increased muscle length  L only              PT Education - 12/17/16 1014    Education provided Yes   Education Details anatomy regarding formation of trigger points and referral patterns. what TPDN is, benefits, what to expect and after care.   Person(s) Educated Patient   Methods Explanation;Verbal cues;Handout   Comprehension Verbalized understanding;Verbal cues required          PT Short Term Goals - 12/17/16 0946      PT SHORT TERM GOAL #1   Title Patient will report 2/10 pain at worst in the cervical spine    Baseline 5/10 today   Time 4   Period Weeks   Status On-going     PT  SHORT TERM GOAL #2   Title Patient will report decreased numbness into his hand    Baseline no change in numbness, some increase in numbness   Time 4   Period Weeks   Status On-going     PT SHORT TERM GOAL #3   Title Patient will report decreased tenderness to palpation in upper traps    Baseline continued spasming and decrease in pain   Time 4   Period Weeks     PT SHORT TERM GOAL #4   Title Patient will increase cervical flexion, extensin, and bilateral rotation by 20 degrees   Baseline improved extension, regression in other ranges   Time 4   Period Weeks   Status On-going     PT SHORT TERM GOAL #5   Title Patient will be independent with HEP    Time 4   Period Weeks   Status Achieved           PT Long Term Goals - 12/17/16 5997      PT LONG TERM GOAL #1   Title Patient will return to the gym without increased pain in order to improve strength    Baseline unable to return to gym    Time 8   Period Weeks   Status Unable to assess     PT LONG TERM GOAL #2   Title Patient will report no pain when lifting items in order to return to work if cleared by the surgeon    Baseline he can lift grocerries, light weight items   Time 8   Period Weeks   Status Partially Met     PT LONG TERM GOAL #3   Title Patient will demsotrate a 39% limitation on FOTO    Baseline 60% limited status 12/05/2016   Time 8   Period Weeks   Status On-going               Plan - 12/17/16 1015    Clinical Impression Statement pt demonstrates continued pain in the L upper trap / cervical spine. TPDN was performed on the L upper trap, obliquos capitis and sub-occipitals. IASTM techniques performed following TPDN and inhibition taping was performed. continued MHP post session for soreness from TPDN, he reported pain at 2/10 at end of session. he would benefit from continued physical therapy to reduce muscle spasm/ tightness and reduce pain.    Rehab Potential Good   PT Frequency 2x / week    PT Duration 3 weeks   PT Treatment/Interventions ADLs/Self Care Home Management;Cryotherapy;Electrical Stimulation;Iontophoresis 44m/ml Dexamethasone;Moist Heat;Ultrasound;Gait training;Stair training;Patient/family education;Neuromuscular re-education;Therapeutic exercise;Therapeutic activities;Manual techniques;Passive range of motion;Taping;Dry needling   PT Next Visit Plan assess  response to TPDN/ taping, Estim PRN,  Continue to progress as tolerated;  instruct patient on proper technique on fitness machines;    PT Home Exercise Plan cervical rotation, upper trap trigger point release, scap retraction, shoulder extension; cervical stretches from cabinet (upper trap stretch, levator stretch, corner stretch, chin tuck)   Consulted and Agree with Plan of Care Patient      Patient will benefit from skilled therapeutic intervention in order to improve the following deficits and impairments:  Decreased activity tolerance, Decreased strength, Impaired sensation, Postural dysfunction, Impaired UE functional use, Increased muscle spasms, Increased fascial restricitons  Visit Diagnosis: Cervicalgia  Other muscle spasm     Problem List Patient Active Problem List   Diagnosis Date Noted  . 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 Cervical spine fracture ([place]) 06/05/2016  . C7 cervical fracture ([place]) 06/05/2016   [place]arr [place] PT, DPT, LAT, ATC  12/17/16  10:20 AM      [facility] [address] [place], [place], [address] Phone: [phone]   Fax:  [phone]  Name: Malik Hernandez MRN: [id number] Date of Birth: 02-05-1976

## 2016-12-19 ENCOUNTER — Ambulatory Visit: Payer: 59 | Admitting: Physical Therapy

## 2016-12-19 DIAGNOSIS — M542 Cervicalgia: Secondary | ICD-10-CM

## 2016-12-19 DIAGNOSIS — M62838 Other muscle spasm: Secondary | ICD-10-CM | POA: Diagnosis not present

## 2016-12-19 NOTE — Therapy (Signed)
[place], [place], [address] Phone: [phone]   Fax:  [phone]  Physical Therapy Treatment  Patient Details  Name: Malik Hernandez MRN: [id number] Date of Birth: Aug 29, 1975 Referring Provider: Dr Marland Kitchen Dity   Encounter Date: 12/19/2016      PT End of Session - 12/19/16 1006    Visit Number 15   Number of Visits 20   Date for PT Re-Evaluation 01/14/17   Authorization Type BCBS    PT Start Time 0930   PT Stop Time 1013   PT Time Calculation (min) 43 min      Past Medical History:  Diagnosis Date  . Headache     Past Surgical History:  Procedure Laterality Date  . ANTERIOR CERVICAL DECOMP/DISCECTOMY FUSION N/A 07/11/2016   Procedure: Cervical six - Cervical Seven  Anterior cervical decompression/discectomy/fusion;  Surgeon: Kevan Ny Ditty, MD;  Location: [place];  Service: Neurosurgery;  Laterality: N/A;  Cervical six - Cervical Seven  Anterior cervical decompression/discectomy/fusion  . CERVICAL FUSION    . MYRINGOTOMY WITH TUBE PLACEMENT    . POSTERIOR CERVICAL FUSION/FORAMINOTOMY N/A 06/05/2016   Procedure: LEFT CERVICAL SIX-SEVEN LAMINOTOMY, CERVICAL SIX-SEVEN DECOMPRESSIVE LAMINECTOMY, CERVICAL FIVE-SIX, CERVICAL SIX-SEVEN, CERVICAL SEVEN-THORACIC ONE DORSAL FUSION;  Surgeon: Kevan Ny Ditty, MD;  Location: [place];  Service: Neurosurgery;  Laterality: N/A;    There were no vitals filed for this visit.      Subjective Assessment - 12/19/16 1122    Subjective The dry needling helped a little .    Currently in Pain? Yes   Pain Score 4    Pain Location Neck   Pain Orientation Posterior   Pain Descriptors / Indicators Aching                         [facility] Adult PT Treatment/Exercise - 12/19/16 0001      Neck Exercises: Seated   Other Seated Exercise UBE level 3, 3 min each way      Neck Exercises: Prone   Other Prone Exercise  prone over ball, I, T, Y, bent  T, alternating Y     Shoulder Exercises: Supine   Other Supine Exercises Supine scap bands with green band horizontal abduction, ER, pullovers with tension, sash x 20 each   while on foam roller.      Shoulder Exercises: Standing   Horizontal ABduction 20 reps  chin tuck into towel at wall    Theraband Level (Shoulder Horizontal ABduction) Level 3 (Green)   External Rotation 20 reps  chin tuck into towel at wall    Theraband Level (Shoulder External Rotation) Level 3 (Green)   Flexion Limitations bilateral pullovers with green band   chin tuck into towel at wall                  PT Short Term Goals - 12/17/16 0946      PT SHORT TERM GOAL #1   Title Patient will report 2/10 pain at worst in the cervical spine    Baseline 5/10 today   Time 4   Period Weeks   Status On-going     PT SHORT TERM GOAL #2   Title Patient will report decreased numbness into his hand    Baseline no change in numbness, some increase in numbness   Time 4   Period Weeks   Status On-going     PT SHORT TERM GOAL #3   Title  Patient will report decreased tenderness to palpation in upper traps    Baseline continued spasming and decrease in pain   Time 4   Period Weeks     PT SHORT TERM GOAL #4   Title Patient will increase cervical flexion, extensin, and bilateral rotation by 20 degrees   Baseline improved extension, regression in other ranges   Time 4   Period Weeks   Status On-going     PT SHORT TERM GOAL #5   Title Patient will be independent with HEP    Time 4   Period Weeks   Status Achieved           PT Long Term Goals - 12/17/16 0948      PT LONG TERM GOAL #1   Title Patient will return to the gym without increased pain in order to improve strength    Baseline unable to return to gym    Time 8   Period Weeks   Status Unable to assess     PT LONG TERM GOAL #2   Title Patient will report no pain when lifting items in order to return to work if cleared by the surgeon     Baseline he can lift grocerries, light weight items   Time 8   Period Weeks   Status Partially Met     PT LONG TERM GOAL #3   Title Patient will demsotrate a 39% limitation on FOTO    Baseline 60% limited status 12/05/2016   Time 8   Period Weeks   Status On-going               Plan - 12/19/16 1120    Clinical Impression Statement Pt reports TPDN helpful last visit. He rates pain less, at 4/10. We focused scapular and cervical stabilzation in prone, supine and standing. Pt reports a decrease in hand numbness. He also reports less episodes of feeling like he needs to lay his head against the back of a chair due to fatigue.    PT Next Visit Plan assess response to TPDN/ taping, Estim PRN,  Continue to progress as tolerated;  instruct patient on proper technique on fitness machines;    PT Home Exercise Plan cervical rotation, upper trap trigger point release, scap retraction, shoulder extension; cervical stretches from cabinet (upper trap stretch, levator stretch, corner stretch, chin tuck)   Consulted and Agree with Plan of Care Patient      Patient will benefit from skilled therapeutic intervention in order to improve the following deficits and impairments:  Decreased activity tolerance, Decreased strength, Impaired sensation, Postural dysfunction, Impaired UE functional use, Increased muscle spasms, Increased fascial restricitons  Visit Diagnosis: Cervicalgia  Other muscle spasm     Problem List Patient Active Problem List   Diagnosis Date Noted  . 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 Cervical spine fracture ([place]) 06/05/2016  . C7 cervical fracture ([place]) 06/05/2016    Dorene Ar, PTA 12/19/2016, 11:29 AM  [place], [place], [address] Phone: [phone]   Fax:  [phone]  Name: Malik Hernandez MRN: [id number] Date of Birth: Jul 31, 1975

## 2016-12-24 ENCOUNTER — Ambulatory Visit: Payer: 59 | Attending: Neurological Surgery | Admitting: Physical Therapy

## 2016-12-24 DIAGNOSIS — M62838 Other muscle spasm: Secondary | ICD-10-CM | POA: Insufficient documentation

## 2016-12-24 DIAGNOSIS — M542 Cervicalgia: Secondary | ICD-10-CM | POA: Insufficient documentation

## 2016-12-25 ENCOUNTER — Encounter: Payer: Self-pay | Admitting: Physical Therapy

## 2016-12-25 NOTE — Therapy (Signed)
[place], [place], [address] Phone: [phone]   Fax:  [phone]  Physical Therapy Treatment  Patient Details  Name: Malik Hernandez MRN: [id number] Date of Birth: November 13, 1975 Referring Provider: Dr Marland Kitchen Dity   Encounter Date: 12/24/2016      PT End of Session - 12/25/16 0808    Visit Number 16   Number of Visits 20   Date for PT Re-Evaluation 01/14/17   Authorization Type BCBS    PT Start Time 0939   PT Stop Time 1015   PT Time Calculation (min) 36 min   Activity Tolerance Patient tolerated treatment well   Behavior During Therapy [facility] for tasks assessed/performed      Past Medical History:  Diagnosis Date  . Headache     Past Surgical History:  Procedure Laterality Date  . ANTERIOR CERVICAL DECOMP/DISCECTOMY FUSION N/A 07/11/2016   Procedure: Cervical six - Cervical Seven  Anterior cervical decompression/discectomy/fusion;  Surgeon: Kevan Ny Ditty, MD;  Location: [place];  Service: Neurosurgery;  Laterality: N/A;  Cervical six - Cervical Seven  Anterior cervical decompression/discectomy/fusion  . CERVICAL FUSION    . MYRINGOTOMY WITH TUBE PLACEMENT    . POSTERIOR CERVICAL FUSION/FORAMINOTOMY N/A 06/05/2016   Procedure: LEFT CERVICAL SIX-SEVEN LAMINOTOMY, CERVICAL SIX-SEVEN DECOMPRESSIVE LAMINECTOMY, CERVICAL FIVE-SIX, CERVICAL SIX-SEVEN, CERVICAL SEVEN-THORACIC ONE DORSAL FUSION;  Surgeon: Kevan Ny Ditty, MD;  Location: [place];  Service: Neurosurgery;  Laterality: N/A;    There were no vitals filed for this visit.      Subjective Assessment - 12/25/16 0803    Subjective Patient reports feeling pretty good after the last visit for about a day then his neck tightned up and his pain increased. He has a headache today that goes up the left side of his temporal area follwing a ram horn pattern. He felt needling helepd in the past. Therapy will needle him again    Limitations House hold  activities;Lifting   How long can you sit comfortably? N/A    How long can you stand comfortably? N/A    How long can you walk comfortably? N/A    Diagnostic tests Nothing post op   Patient Stated Goals to go back to work   Currently in Pain? Yes   Pain Score 4    Pain Location Neck   Pain Orientation Posterior   Pain Descriptors / Indicators Aching   Pain Type Chronic pain   Pain Onset More than a month ago   Pain Frequency Constant   Aggravating Factors  picking up item    Pain Relieving Factors sitting, heat    Multiple Pain Sites No                         [facility] Adult PT Treatment/Exercise - 12/25/16 0001      Manual Therapy   Soft tissue mobilization IASTM over the L upper trap; deep trigger point release into cervical paraspianls and upper trap           Trigger Point Dry Needling - 12/25/16 0818    Consent Given? Yes   Education Handout Provided Yes   Muscles Treated Upper Body Upper trapezius;Suboccipitals muscle group;Levator scapulae  cervical multifidis twitch ellicited    Upper Trapezius Response Twitch reponse elicited   SubOccipitals Response Twitch response elicited   Levator Scapulae Response Twitch response elicited              PT Education -  12/25/16 0806    Education provided Yes   Education Details Benefits and risks of TPDN over thelung fields    Person(s) Educated Patient   Methods Explanation;Verbal cues;Handout   Comprehension Verbalized understanding;Returned demonstration          PT Short Term Goals - 12/25/16 0817      PT SHORT TERM GOAL #1   Title Patient will report 2/10 pain at worst in the cervical spine    Baseline 5/10 today   Time 4   Period Weeks   Status On-going     PT SHORT TERM GOAL #2   Title Patient will report decreased numbness into his hand    Baseline no change in numbness, some increase in numbness   Time 4   Period Weeks   Status On-going     PT SHORT TERM GOAL #3   Title Patient  will report decreased tenderness to palpation in upper traps    Baseline continued spasming and decrease in pain   Time 4   Period Weeks   Status On-going     PT SHORT TERM GOAL #4   Title Patient will increase cervical flexion, extensin, and bilateral rotation by 20 degrees   Baseline improved extension, regression in other ranges   Time 4   Period Weeks   Status On-going     PT SHORT TERM GOAL #5   Title Patient will be independent with HEP    Baseline Able to perform chin tucks and levator stretch with min cues.    Time 4   Period Weeks   Status Achieved           PT Long Term Goals - 12/17/16 0948      PT LONG TERM GOAL #1   Title Patient will return to the gym without increased pain in order to improve strength    Baseline unable to return to gym    Time 8   Period Weeks   Status Unable to assess     PT LONG TERM GOAL #2   Title Patient will report no pain when lifting items in order to return to work if cleared by the surgeon    Baseline he can lift grocerries, light weight items   Time 8   Period Weeks   Status Partially Met     PT LONG TERM GOAL #3   Title Patient will demsotrate a 39% limitation on FOTO    Baseline 60% limited status 12/05/2016   Time 8   Period Weeks   Status On-going               Plan - 12/25/16 1950    Clinical Impression Statement Patient was 9 minutes late for his appointment. He agreed to [facility] today. He had a headache upon arrival. Therapy needled his subocciptals and along with sub occipital release he was able to leave without a headache. Needling and IASTYM to the upper trap were able to increase his leftrotation to 35 degrees and right to 30 degrees. Therapy will continue TPDN and manual therapy for 3-4 more visits to maximize benefits.    Clinical Presentation Stable   Clinical Decision Making Low   Rehab Potential Good   PT Frequency 2x / week   PT Duration 3 weeks   PT Treatment/Interventions ADLs/Self Care Home  Management;Cryotherapy;Electrical Stimulation;Iontophoresis 35m/ml Dexamethasone;Moist Heat;Ultrasound;Gait training;Stair training;Patient/family education;Neuromuscular re-education;Therapeutic exercise;Therapeutic activities;Manual techniques;Passive range of motion;Taping;Dry needling   PT Next Visit Plan assess response to TPDN/ taping, Estim  PRN,  Continue to progress as tolerated;  instruct patient on proper technique on fitness machines;    PT Home Exercise Plan cervical rotation, upper trap trigger point release, scap retraction, shoulder extension; cervical stretches from cabinet (upper trap stretch, levator stretch, corner stretch, chin tuck)   Consulted and Agree with Plan of Care Patient      Patient will benefit from skilled therapeutic intervention in order to improve the following deficits and impairments:  Decreased activity tolerance, Decreased strength, Impaired sensation, Postural dysfunction, Impaired UE functional use, Increased muscle spasms, Increased fascial restricitons  Visit Diagnosis: Cervicalgia  Other muscle spasm     Problem List Patient Active Problem List   Diagnosis Date Noted  . 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 Cervical spine fracture ([place]) 06/05/2016  . C7 cervical fracture ([place]) 06/05/2016    Carney Living PT DPT  12/25/2016, 8:22 AM  [facility] [address] [place], [place], [address] Phone: [phone]   Fax:  [phone]  Name: Malik E Benedetti MRN: [id number] Date of Birth: 02-06-1976

## 2016-12-26 ENCOUNTER — Ambulatory Visit: Payer: 59 | Admitting: Physical Therapy

## 2016-12-26 ENCOUNTER — Encounter: Payer: Self-pay | Admitting: Physical Therapy

## 2016-12-26 DIAGNOSIS — Z6830 Body mass index (BMI) 30.0-30.9, adult: Secondary | ICD-10-CM | POA: Diagnosis not present

## 2016-12-26 DIAGNOSIS — M542 Cervicalgia: Secondary | ICD-10-CM

## 2016-12-26 DIAGNOSIS — M62838 Other muscle spasm: Secondary | ICD-10-CM

## 2016-12-26 DIAGNOSIS — M5412 Radiculopathy, cervical region: Secondary | ICD-10-CM | POA: Diagnosis not present

## 2016-12-27 NOTE — Therapy (Signed)
[place], [place], [address] Phone: [phone]   Fax:  [phone]  Physical Therapy Treatment  Patient Details  Name: Malik Hernandez MRN: [id number] Date of Birth: Nov 26, 1975 Referring Provider: Dr Marland Kitchen Dity   Encounter Date: 12/26/2016      PT End of Session - 12/27/16 0824    Visit Number 17   Number of Visits 20   Date for PT Re-Evaluation 01/14/17   Authorization Type BCBS    PT Start Time 0301   PT Stop Time 0340   PT Time Calculation (min) 39 min   Activity Tolerance Patient tolerated treatment well   Behavior During Therapy [facility] for tasks assessed/performed      Past Medical History:  Diagnosis Date  . Headache     Past Surgical History:  Procedure Laterality Date  . ANTERIOR CERVICAL DECOMP/DISCECTOMY FUSION N/A 07/11/2016   Procedure: Cervical six - Cervical Seven  Anterior cervical decompression/discectomy/fusion;  Surgeon: Kevan Ny Ditty, MD;  Location: [place];  Service: Neurosurgery;  Laterality: N/A;  Cervical six - Cervical Seven  Anterior cervical decompression/discectomy/fusion  . CERVICAL FUSION    . MYRINGOTOMY WITH TUBE PLACEMENT    . POSTERIOR CERVICAL FUSION/FORAMINOTOMY N/A 06/05/2016   Procedure: LEFT CERVICAL SIX-SEVEN LAMINOTOMY, CERVICAL SIX-SEVEN DECOMPRESSIVE LAMINECTOMY, CERVICAL FIVE-SIX, CERVICAL SIX-SEVEN, CERVICAL SEVEN-THORACIC ONE DORSAL FUSION;  Surgeon: Kevan Ny Ditty, MD;  Location: [place];  Service: Neurosurgery;  Laterality: N/A;    There were no vitals filed for this visit.      Subjective Assessment - 12/26/16 1527    Subjective Patient reports he has been a little looser over the past few days.    Diagnostic tests Nothing post op   Patient Stated Goals to go back to work   Currently in Pain? Yes   Pain Score 4    Pain Location Neck   Pain Orientation Posterior   Pain Descriptors / Indicators Aching   Pain Type Chronic pain   Pain  Onset More than a month ago   Pain Frequency Constant   Aggravating Factors  picking items up    Pain Relieving Factors sitting, heat    Multiple Pain Sites No                         [facility] Adult PT Treatment/Exercise - 12/27/16 0001      Shoulder Exercises: Supine   Other Supine Exercises Supine scap bands with green band horizontal abduction, ER, pullovers with tension, sash x 20 each   while on foam roller.      Shoulder Exercises: Standing   Horizontal ABduction --  chin tuck into towel at wall      Shoulder Exercises: Power [employer]   Other Power [employer] Exercises lat pull down 25lb x20; shoulder row x20 each handle      Manual Therapy   Soft tissue mobilization IASTM over the L upper trap; deep trigger point release into cervical paraspianls and upper trap           Trigger Point Dry Needling - 12/27/16 2902    Consent Given? Yes   Education Handout Provided Yes   Upper Trapezius Response Twitch reponse elicited   Levator Scapulae Response Twitch response elicited              PT Education - 12/27/16 0823    Education provided Yes   Education Details trigger point dry needling benefits and rationale  Person(s) Educated Patient   Methods Explanation;Demonstration;Verbal cues;Handout   Comprehension Verbalized understanding;Returned demonstration          PT Short Term Goals - 12/25/16 0817      PT SHORT TERM GOAL #1   Title Patient will report 2/10 pain at worst in the cervical spine    Baseline 5/10 today   Time 4   Period Weeks   Status On-going     PT SHORT TERM GOAL #2   Title Patient will report decreased numbness into his hand    Baseline no change in numbness, some increase in numbness   Time 4   Period Weeks   Status On-going     PT SHORT TERM GOAL #3   Title Patient will report decreased tenderness to palpation in upper traps    Baseline continued spasming and decrease in pain   Time 4   Period Weeks   Status  On-going     PT SHORT TERM GOAL #4   Title Patient will increase cervical flexion, extensin, and bilateral rotation by 20 degrees   Baseline improved extension, regression in other ranges   Time 4   Period Weeks   Status On-going     PT SHORT TERM GOAL #5   Title Patient will be independent with HEP    Baseline Able to perform chin tucks and levator stretch with min cues.    Time 4   Period Weeks   Status Achieved           PT Long Term Goals - 12/17/16 0948      PT LONG TERM GOAL #1   Title Patient will return to the gym without increased pain in order to improve strength    Baseline unable to return to gym    Time 8   Period Weeks   Status Unable to assess     PT LONG TERM GOAL #2   Title Patient will report no pain when lifting items in order to return to work if cleared by the surgeon    Baseline he can lift grocerries, light weight items   Time 8   Period Weeks   Status Partially Met     PT LONG TERM GOAL #3   Title Patient will demsotrate a 39% limitation on FOTO    Baseline 60% limited status 12/05/2016   Time 8   Period Weeks   Status On-going               Plan - 12/27/16 0826    Clinical Impression Statement Patient recieved 2 needles to his uppoer trap with a great twitch; Therapy reviewed light gym exercises with the patient and things he can and cant do. He would benefit from further dry needling to reduce pain and inflammation. he is having a headache much less.    Clinical Presentation Stable   Clinical Decision Making Low   Rehab Potential Good   PT Frequency 2x / week   PT Duration 8 weeks   PT Treatment/Interventions ADLs/Self Care Home Management;Cryotherapy;Electrical Stimulation;Iontophoresis 24m/ml Dexamethasone;Moist Heat;Ultrasound;Gait training;Stair training;Patient/family education;Neuromuscular re-education;Therapeutic exercise;Therapeutic activities;Manual techniques;Passive range of motion;Taping;Dry needling   PT Next Visit  Plan assess response to TPDN/ taping, Estim PRN,  Continue to progress as tolerated;  instruct patient on proper technique on fitness machines;    PT Home Exercise Plan cervical rotation, upper trap trigger point release, scap retraction, shoulder extension; cervical stretches from cabinet (upper trap stretch, levator stretch, corner stretch, chin tuck)  Consulted and Agree with Plan of Care Patient      Patient will benefit from skilled therapeutic intervention in order to improve the following deficits and impairments:  Decreased activity tolerance, Decreased strength, Impaired sensation, Postural dysfunction, Impaired UE functional use, Increased muscle spasms, Increased fascial restricitons  Visit Diagnosis: Cervicalgia  Other muscle spasm     Problem List Patient Active Problem List   Diagnosis Date Noted  . 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 Cervical spine fracture ([place]) 06/05/2016  . C7 cervical fracture ([place]) 06/05/2016    Carney Living PT DPT  12/27/2016, 8:30 AM  [facility] [address] [place], [place], [address] Phone: [phone]   Fax:  [phone]  Name: Malik E Willetts MRN: [id number] Date of Birth: 11-Apr-1976

## 2016-12-31 ENCOUNTER — Ambulatory Visit: Payer: 59 | Admitting: Physical Therapy

## 2016-12-31 ENCOUNTER — Encounter: Payer: Self-pay | Admitting: Physical Therapy

## 2016-12-31 DIAGNOSIS — M542 Cervicalgia: Secondary | ICD-10-CM

## 2016-12-31 DIAGNOSIS — M62838 Other muscle spasm: Secondary | ICD-10-CM | POA: Diagnosis not present

## 2016-12-31 NOTE — Therapy (Signed)
[place], [place], [address] Phone: [phone]   Fax:  [phone]  Physical Therapy Treatment  Patient Details  Name: Malik Hernandez MRN: [id number] Date of Birth: 02/21/1976 Referring Provider: Dr Marland Kitchen Dity   Encounter Date: 12/31/2016      PT End of Session - 12/31/16 1252    Visit Number 18   Number of Visits 20   Date for PT Re-Evaluation 01/14/17   Authorization Type BCBS    PT Start Time 0930   PT Stop Time 1016   PT Time Calculation (min) 46 min   Activity Tolerance Patient tolerated treatment well   Behavior During Therapy [facility] for tasks assessed/performed      Past Medical History:  Diagnosis Date  . Headache     Past Surgical History:  Procedure Laterality Date  . ANTERIOR CERVICAL DECOMP/DISCECTOMY FUSION N/A 07/11/2016   Procedure: Cervical six - Cervical Seven  Anterior cervical decompression/discectomy/fusion;  Surgeon: Kevan Ny Ditty, MD;  Location: [place];  Service: Neurosurgery;  Laterality: N/A;  Cervical six - Cervical Seven  Anterior cervical decompression/discectomy/fusion  . CERVICAL FUSION    . MYRINGOTOMY WITH TUBE PLACEMENT    . POSTERIOR CERVICAL FUSION/FORAMINOTOMY N/A 06/05/2016   Procedure: LEFT CERVICAL SIX-SEVEN LAMINOTOMY, CERVICAL SIX-SEVEN DECOMPRESSIVE LAMINECTOMY, CERVICAL FIVE-SIX, CERVICAL SIX-SEVEN, CERVICAL SEVEN-THORACIC ONE DORSAL FUSION;  Surgeon: Kevan Ny Ditty, MD;  Location: [place];  Service: Neurosurgery;  Laterality: N/A;    There were no vitals filed for this visit.      Subjective Assessment - 12/31/16 1249    Subjective Patient reports a headache over the weekend. He reports no significant increase in his pain after going thorugh gym exercises. He feels like his neck is looser.    Limitations House hold activities;Lifting   How long can you sit comfortably? N/A    How long can you stand comfortably? N/A    How long can you walk  comfortably? N/A    Diagnostic tests Nothing post op   Currently in Pain? Yes   Pain Score 2    Pain Location Neck   Pain Orientation Posterior   Pain Descriptors / Indicators Aching   Pain Type Chronic pain   Pain Frequency Constant   Aggravating Factors  picking items up    Pain Relieving Factors sitting, heat                          [facility] Adult PT Treatment/Exercise - 12/31/16 0001      Shoulder Exercises: Seated   Other Seated Exercises Seated shoulder press 2x10;    Other Seated Exercises Bicpes curls 2x10      Shoulder Exercises: Power [employer] lat pull down 25lb 3x10; shoulder row 3x10 each handle    Other Power Tower Exercises triceps pulldwon 2 plates 2x10     Manual Therapy   Soft tissue mobilization IASTM over the L upper trap; deep trigger point release into cervical paraspianls and upper trap           Trigger Point Dry Needling - 12/31/16 1247    Consent Given? Yes   Education Handout Provided Yes   Muscles Treated Upper Body Upper trapezius   Muscles Treated Lower Body --  cervical paraspinals: C5- C6 good twitch respoce at C5   Upper Trapezius Response Twitch reponse elicited   Levator Scapulae Response Twitch response elicited  PT Education - 12/31/16 1251    Education provided Yes   Education Details improtance of keeping weights low and reps highin the beggining    Person(s) Educated Patient   Methods Explanation;Demonstration;Verbal cues;Handout   Comprehension Verbalized understanding;Returned demonstration          PT Short Term Goals - 12/25/16 0817      PT SHORT TERM GOAL #1   Title Patient will report 2/10 pain at worst in the cervical spine    Baseline 5/10 today   Time 4   Period Weeks   Status On-going     PT SHORT TERM GOAL #2   Title Patient will report decreased numbness into his hand    Baseline no change in numbness, some increase in numbness   Time 4    Period Weeks   Status On-going     PT SHORT TERM GOAL #3   Title Patient will report decreased tenderness to palpation in upper traps    Baseline continued spasming and decrease in pain   Time 4   Period Weeks   Status On-going     PT SHORT TERM GOAL #4   Title Patient will increase cervical flexion, extensin, and bilateral rotation by 20 degrees   Baseline improved extension, regression in other ranges   Time 4   Period Weeks   Status On-going     PT SHORT TERM GOAL #5   Title Patient will be independent with HEP    Baseline Able to perform chin tucks and levator stretch with min cues.    Time 4   Period Weeks   Status Achieved           PT Long Term Goals - 12/17/16 0948      PT LONG TERM GOAL #1   Title Patient will return to the gym without increased pain in order to improve strength    Baseline unable to return to gym    Time 8   Period Weeks   Status Unable to assess     PT LONG TERM GOAL #2   Title Patient will report no pain when lifting items in order to return to work if cleared by the surgeon    Baseline he can lift grocerries, light weight items   Time 8   Period Weeks   Status Partially Met     PT LONG TERM GOAL #3   Title Patient will demsotrate a 39% limitation on FOTO    Baseline 60% limited status 12/05/2016   Time 8   Period Weeks   Status On-going               Plan - 12/31/16 1253    Clinical Impression Statement Patient tolerated treatment well. He was able to complete a gym program with no increase in pain. He was cued not to set his shoulders. Therapy will review response. He will come for another visit this week and 2 more next week then likely discharge.    Clinical Presentation Stable   Clinical Decision Making Low   Rehab Potential Good   PT Frequency 2x / week   PT Duration 8 weeks   PT Treatment/Interventions ADLs/Self Care Home Management;Cryotherapy;Electrical Stimulation;Iontophoresis 49m/ml Dexamethasone;Moist  Heat;Ultrasound;Gait training;Stair training;Patient/family education;Neuromuscular re-education;Therapeutic exercise;Therapeutic activities;Manual techniques;Passive range of motion;Taping;Dry needling   PT Next Visit Plan assess response to TPDN/ taping, Estim PRN,  Continue to progress as tolerated;  instruct patient on proper technique on fitness machines;    PT Home Exercise  Plan cervical rotation, upper trap trigger point release, scap retraction, shoulder extension; cervical stretches from cabinet (upper trap stretch, levator stretch, corner stretch, chin tuck)   Consulted and Agree with Plan of Care Patient      Patient will benefit from skilled therapeutic intervention in order to improve the following deficits and impairments:  Decreased activity tolerance, Decreased strength, Impaired sensation, Postural dysfunction, Impaired UE functional use, Increased muscle spasms, Increased fascial restricitons  Visit Diagnosis: Cervicalgia  Other muscle spasm     Problem List Patient Active Problem List   Diagnosis Date Noted  . 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 Cervical spine fracture ([place]) 06/05/2016  . C7 cervical fracture ([place]) 06/05/2016    Carney Living PT DPT  12/31/2016, 1:01 PM  [facility] [address] [place], [place], [address] Phone: [phone]   Fax:  [phone]  Name: Malik E Zoeller MRN: [id number] Date of Birth: [birth year]

## 2017-01-02 ENCOUNTER — Ambulatory Visit: Payer: 59 | Admitting: Physical Therapy

## 2017-01-02 ENCOUNTER — Encounter: Payer: Self-pay | Admitting: Physical Therapy

## 2017-01-07 ENCOUNTER — Ambulatory Visit: Payer: 59 | Admitting: Physical Therapy

## 2017-01-07 ENCOUNTER — Encounter: Payer: Self-pay | Admitting: Physical Therapy

## 2017-01-07 DIAGNOSIS — M62838 Other muscle spasm: Secondary | ICD-10-CM

## 2017-01-07 DIAGNOSIS — M542 Cervicalgia: Secondary | ICD-10-CM | POA: Diagnosis not present

## 2017-01-07 NOTE — Therapy (Addendum)
[place], [place], [address] Phone: [phone]   Fax:  [phone]  Physical Therapy Treatment/ Discharge   Patient Details  Name: Malik Hernandez MRN: [id number] Date of Birth: [birth year] Referring Provider: Dr Marland Kitchen Dity   Encounter Date: 01/07/2017      PT End of Session - 01/07/17 1223    Visit Number 19   Number of Visits 20   Date for PT Re-Evaluation 01/14/17   Authorization Type [employer]    PT Start Time 0928   PT Stop Time 1015   PT Time Calculation (min) 47 min   Activity Tolerance Patient tolerated treatment well   Behavior During Therapy [facility] for tasks assessed/performed      Past Medical History:  Diagnosis Date  . Headache     Past Surgical History:  Procedure Laterality Date  . ANTERIOR CERVICAL DECOMP/DISCECTOMY FUSION N/A 07/11/2016   Procedure: Cervical six - Cervical Seven  Anterior cervical decompression/discectomy/fusion;  Surgeon: Kevan Ny Ditty, MD;  Location: [facility];  Service: Neurosurgery;  Laterality: N/A;  Cervical six - Cervical Seven  Anterior cervical decompression/discectomy/fusion  . CERVICAL FUSION    . MYRINGOTOMY WITH TUBE PLACEMENT    . POSTERIOR CERVICAL FUSION/FORAMINOTOMY N/A 06/05/2016   Procedure: LEFT CERVICAL SIX-SEVEN LAMINOTOMY, CERVICAL SIX-SEVEN DECOMPRESSIVE LAMINECTOMY, CERVICAL FIVE-SIX, CERVICAL SIX-SEVEN, CERVICAL SEVEN-THORACIC ONE DORSAL FUSION;  Surgeon: Kevan Ny Ditty, MD;  Location: [facility];  Service: Neurosurgery;  Laterality: N/A;    There were no vitals filed for this visit.      Subjective Assessment - 01/07/17 1220    Subjective Patient reports he feels like it may be better. He has been increasing his activity level without an increase in pain.    Limitations House hold activities;Lifting   How long can you sit comfortably? N/A    How long can you stand comfortably? N/A    How long can you walk comfortably? N/A     Diagnostic tests Nothing post op   Patient Stated Goals to go back to work   Currently in Pain? Yes   Pain Score 3    Pain Location Neck   Pain Orientation Posterior   Pain Descriptors / Indicators Aching   Pain Type Chronic pain   Pain Onset More than a month ago   Pain Frequency Constant   Aggravating Factors  picking items up    Pain Relieving Factors sitting and heat   Multiple Pain Sites No                         [facility] Adult PT Treatment/Exercise - 01/07/17 0001      Shoulder Exercises: Seated   Other Seated Exercises Seated shoulder press 2x10;    Other Seated Exercises Bicpes curls 2x10      Shoulder Exercises: Power Futures trader lat pull down 25lb 3x10; shoulder row 3x10 each handle    Other Power Tower Exercises triceps pulldown 2 plates 2x10     Manual Therapy   Soft tissue mobilization IASTM over the L upper trap; deep trigger point release into cervical paraspianls and upper trap           Trigger Point Dry Needling - 01/07/17 1217    Consent Given? Yes   Education Handout Provided Yes   Muscles Treated Upper Body Upper trapezius  cervical paraspinals C5/ C6 bilateral   Upper Trapezius Response Twitch reponse elicited  twitch response  on both paraspinals              PT Education - 01/07/17 1222    Education provided Yes   Education Details reviewed exercises program and technique    Person(s) Educated Patient   Methods Explanation;Demonstration;Tactile cues;Verbal cues   Comprehension Verbalized understanding;Returned demonstration          PT Short Term Goals - 01/07/17 1224      PT SHORT TERM GOAL #1   Title Patient will report 2/10 pain at worst in the cervical spine    Baseline 5/10 today   Time 4   Period Weeks   Status On-going     PT SHORT TERM GOAL #2   Title Patient will report decreased numbness into his hand    Baseline no change in numbness, some increase in numbness   Time 4    Period Weeks   Status On-going     PT SHORT TERM GOAL #3   Title Patient will report decreased tenderness to palpation in upper traps    Baseline continued spasming and decrease in pain   Time 4   Period Weeks   Status On-going     PT SHORT TERM GOAL #4   Title Patient will increase cervical flexion, extensin, and bilateral rotation by 20 degrees   Baseline improved extension, regression in other ranges   Time 4   Period Weeks   Status On-going     PT SHORT TERM GOAL #5   Title Patient will be independent with HEP    Baseline Able to perform chin tucks and levator stretch with min cues.    Time 4   Period Weeks   Status Achieved           PT Long Term Goals - 12/17/16 0948      PT LONG TERM GOAL #1   Title Patient will return to the gym without increased pain in order to improve strength    Baseline unable to return to gym    Time 8   Period Weeks   Status Unable to assess     PT LONG TERM GOAL #2   Title Patient will report no pain when lifting items in order to return to work if cleared by the surgeon    Baseline he can lift grocerries, light weight items   Time 8   Period Weeks   Status Partially Met     PT LONG TERM GOAL #3   Title Patient will demsotrate a 39% limitation on FOTO    Baseline 60% limited status 12/05/2016   Time 8   Period Weeks   Status On-going               Plan - 01/07/17 1223    Clinical Impression Statement Patient tolerated gym exercises well. he was fatigued but no increase in pain. Good twitch response elicited on the right today with pain flet on the lleft side with TPDN. He will likely discharge next visit.    Clinical Presentation Stable   Clinical Decision Making Low   Rehab Potential Good   PT Frequency 2x / week   PT Duration 8 weeks   PT Treatment/Interventions ADLs/Self Care Home Management;Cryotherapy;Electrical Stimulation;Iontophoresis 9m/ml Dexamethasone;Moist Heat;Ultrasound;Gait training;Stair  training;Patient/family education;Neuromuscular re-education;Therapeutic exercise;Therapeutic activities;Manual techniques;Passive range of motion;Taping;Dry needling   PT Next Visit Plan assess response to TPDN/ taping, Estim PRN,  Continue to progress as tolerated;  instruct patient on proper technique on fitness machines; FOTO  PT Home Exercise Plan cervical rotation, upper trap trigger point release, scap retraction, shoulder extension; cervical stretches from cabinet (upper trap stretch, levator stretch, corner stretch, chin tuck)   Consulted and Agree with Plan of Care Patient      Patient will benefit from skilled therapeutic intervention in order to improve the following deficits and impairments:  Decreased activity tolerance, Decreased strength, Impaired sensation, Postural dysfunction, Impaired UE functional use, Increased muscle spasms, Increased fascial restricitons  Visit Diagnosis: Cervicalgia  Other muscle spasm   PHYSICAL THERAPY DISCHARGE SUMMARY  Visits from Start of Care: 19  Current functional level related to goals / functional outcomes: Improved movement and pain but still very limited.    Remaining deficits: decreased ROM and pain    Education / Equipment: HEP Plan: Patient agrees to discharge.  Patient goals were not met. Patient is being discharged due to meeting the stated rehab goals.  ?????       Problem List Patient Active Problem List   Diagnosis Date Noted  . 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 Cervical spine fracture ([place] [facility]) 06/05/2016  . C7 cervical fracture ([place]) 06/05/2016    Carney Living PT DPT  01/07/2017, 12:26 PM  [facility] [address] [place], [place], [address] Phone: [phone]   Fax:  [phone]  Name: Malik E Lewing MRN: [id number] Date of Birth: [birth year]

## 2017-01-09 ENCOUNTER — Ambulatory Visit: Payer: 59 | Admitting: Physical Therapy

## 2017-02-03 ENCOUNTER — Encounter: Payer: Self-pay | Admitting: Physical Therapy

## 2017-02-06 DIAGNOSIS — S0990XA Unspecified injury of head, initial encounter: Secondary | ICD-10-CM | POA: Diagnosis not present

## 2017-02-06 DIAGNOSIS — Z6831 Body mass index (BMI) 31.0-31.9, adult: Secondary | ICD-10-CM | POA: Diagnosis not present

## 2017-02-06 DIAGNOSIS — S199XXA Unspecified injury of neck, initial encounter: Secondary | ICD-10-CM | POA: Diagnosis not present

## 2017-02-06 DIAGNOSIS — S12600D Unspecified displaced fracture of seventh cervical vertebra, subsequent encounter for fracture with routine healing: Secondary | ICD-10-CM | POA: Diagnosis not present

## 2017-02-18 DIAGNOSIS — M791 Myalgia: Secondary | ICD-10-CM | POA: Diagnosis not present

## 2017-02-18 DIAGNOSIS — Z6831 Body mass index (BMI) 31.0-31.9, adult: Secondary | ICD-10-CM | POA: Diagnosis not present

## 2017-02-18 DIAGNOSIS — M5412 Radiculopathy, cervical region: Secondary | ICD-10-CM | POA: Diagnosis not present

## 2017-02-18 DIAGNOSIS — M961 Postlaminectomy syndrome, not elsewhere classified: Secondary | ICD-10-CM | POA: Diagnosis not present

## 2017-02-18 DIAGNOSIS — M5020 Other cervical disc displacement, unspecified cervical region: Secondary | ICD-10-CM | POA: Diagnosis not present

## 2017-02-18 DIAGNOSIS — M47812 Spondylosis without myelopathy or radiculopathy, cervical region: Secondary | ICD-10-CM | POA: Diagnosis not present

## 2017-05-29 DIAGNOSIS — M47812 Spondylosis without myelopathy or radiculopathy, cervical region: Secondary | ICD-10-CM | POA: Diagnosis not present

## 2017-10-29 DIAGNOSIS — H52223 Regular astigmatism, bilateral: Secondary | ICD-10-CM | POA: Diagnosis not present
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
## 2018-05-11 IMAGING — DX DG CHEST 2V
2 series · 2 of 2 positions shown · non-contrast
Comparison: 09/19/2014

CLINICAL DATA: Stabbing left-sided chest pain for 2 days, worse
today.

EXAM:
CHEST  2 VIEW

[chest pa]
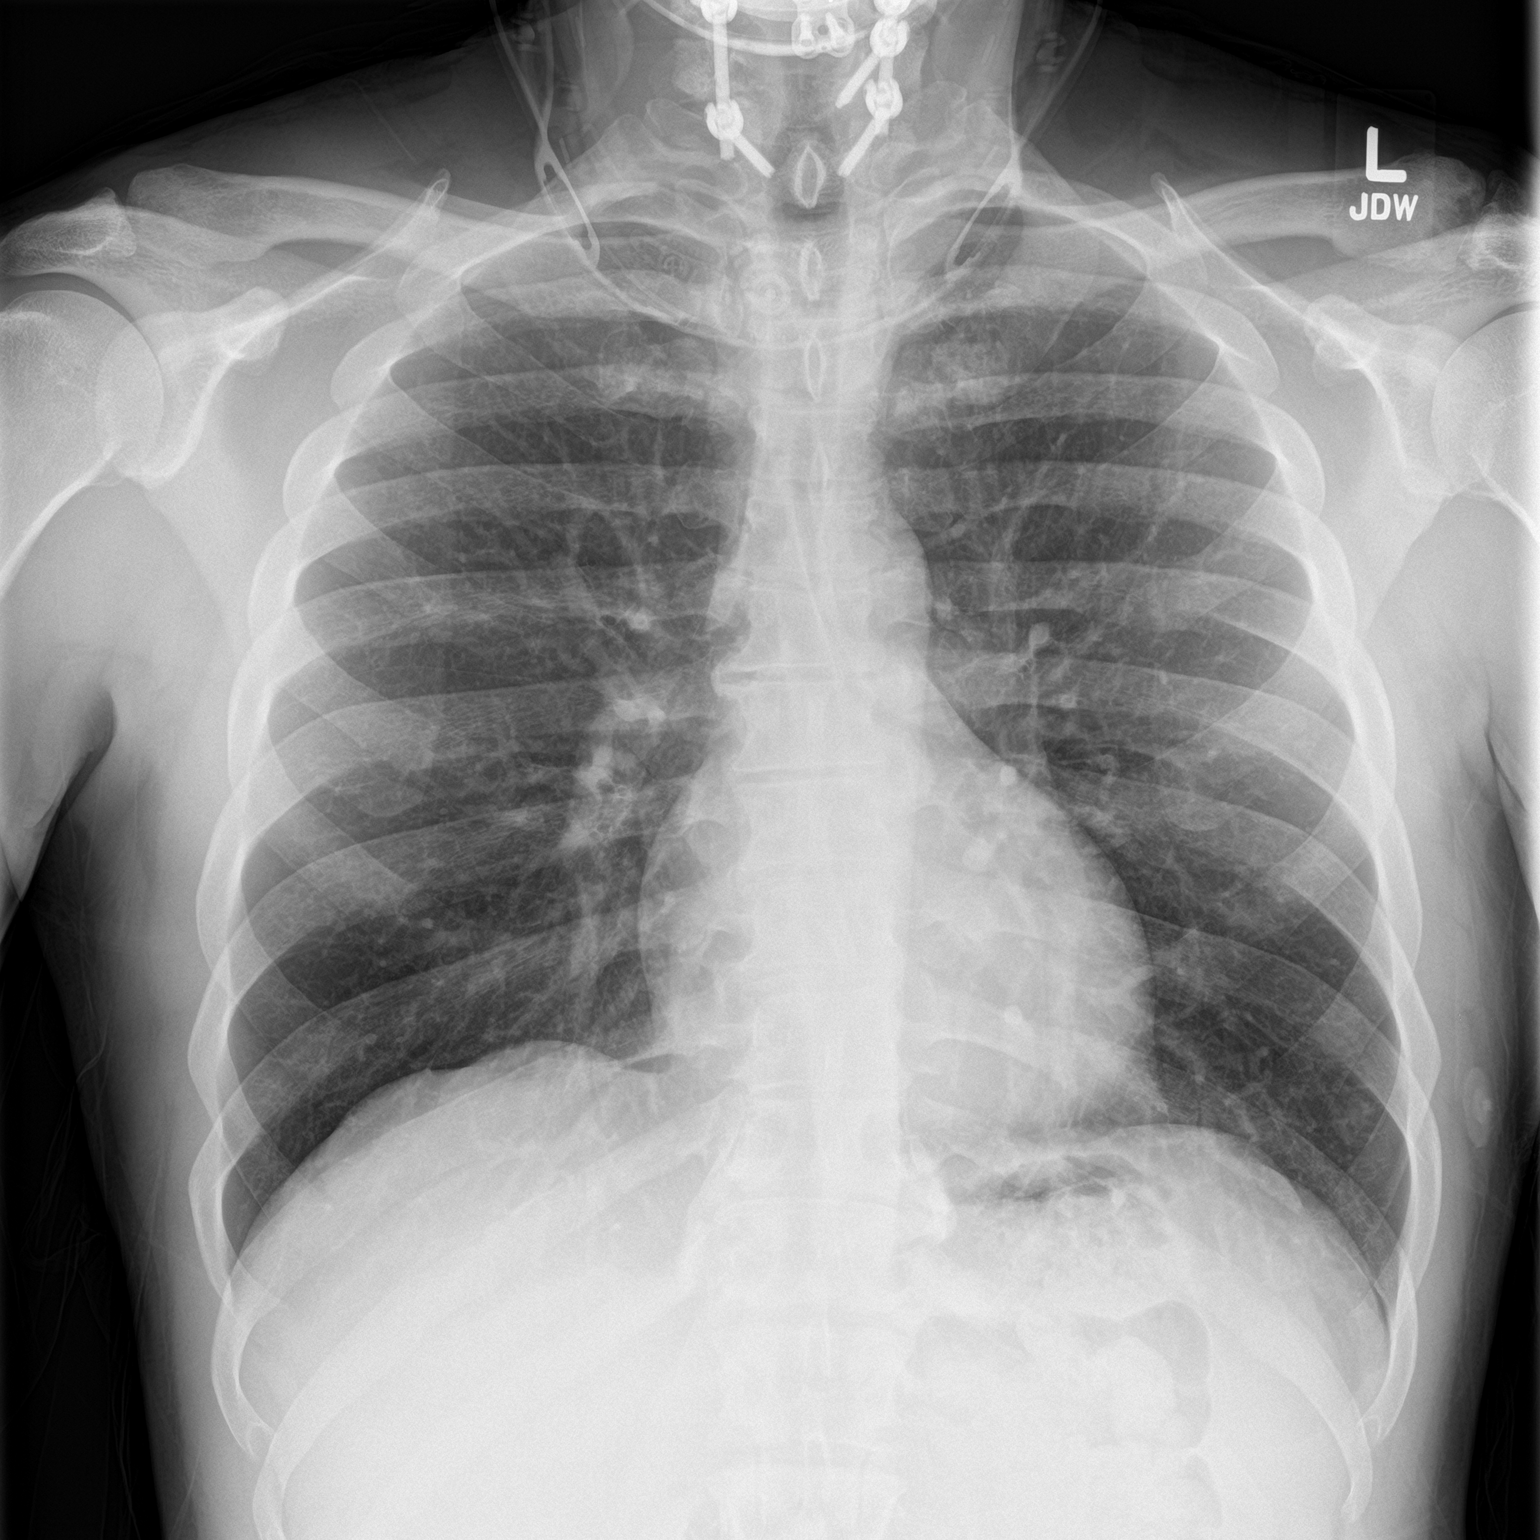

[chest lat]
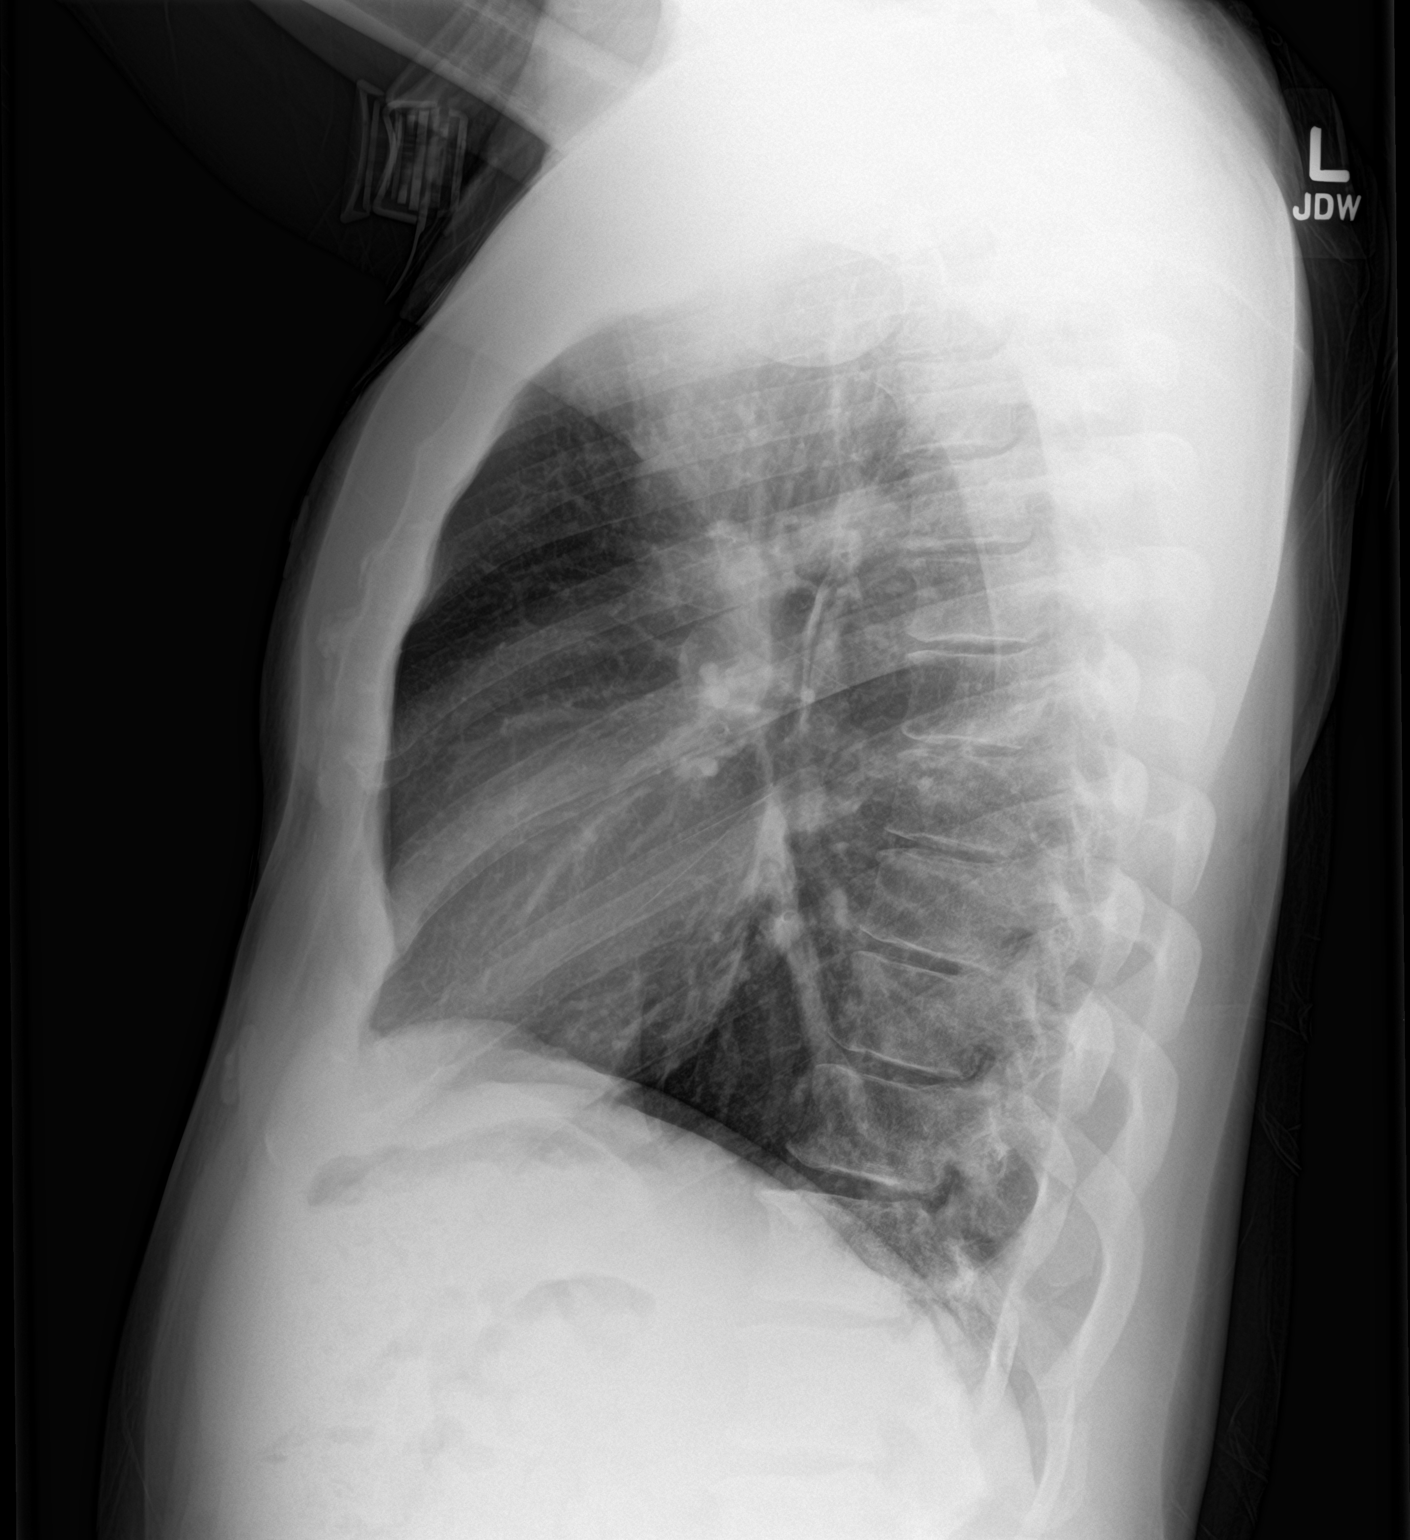

[2 of 2 positions shown; findings below may reference images not displayed]

FINDINGS: Normal heart size and mediastinal contours. No acute infiltrate or
edema. No effusion or pneumothorax. Interval anterior and posterior
cervical spine fusion with collar in place.
IMPRESSION: No acute finding.

## 2018-06-08 IMAGING — RF DG CERVICAL SPINE 1V
1 series · 1 of 1 positions shown · non-contrast
Comparison: MRI cervical spine 07/03/2016. Single lateral view of
the cervical spine 06/05/2016.

CLINICAL DATA: C6-7 ACDF.  Intraoperative imaging.

EXAM:
DG C-ARM 61-120 MIN; DG CERVICAL SPINE - 1 VIEW

[Series 1: run · 1 of 1 slices shown]
[im 1/1]
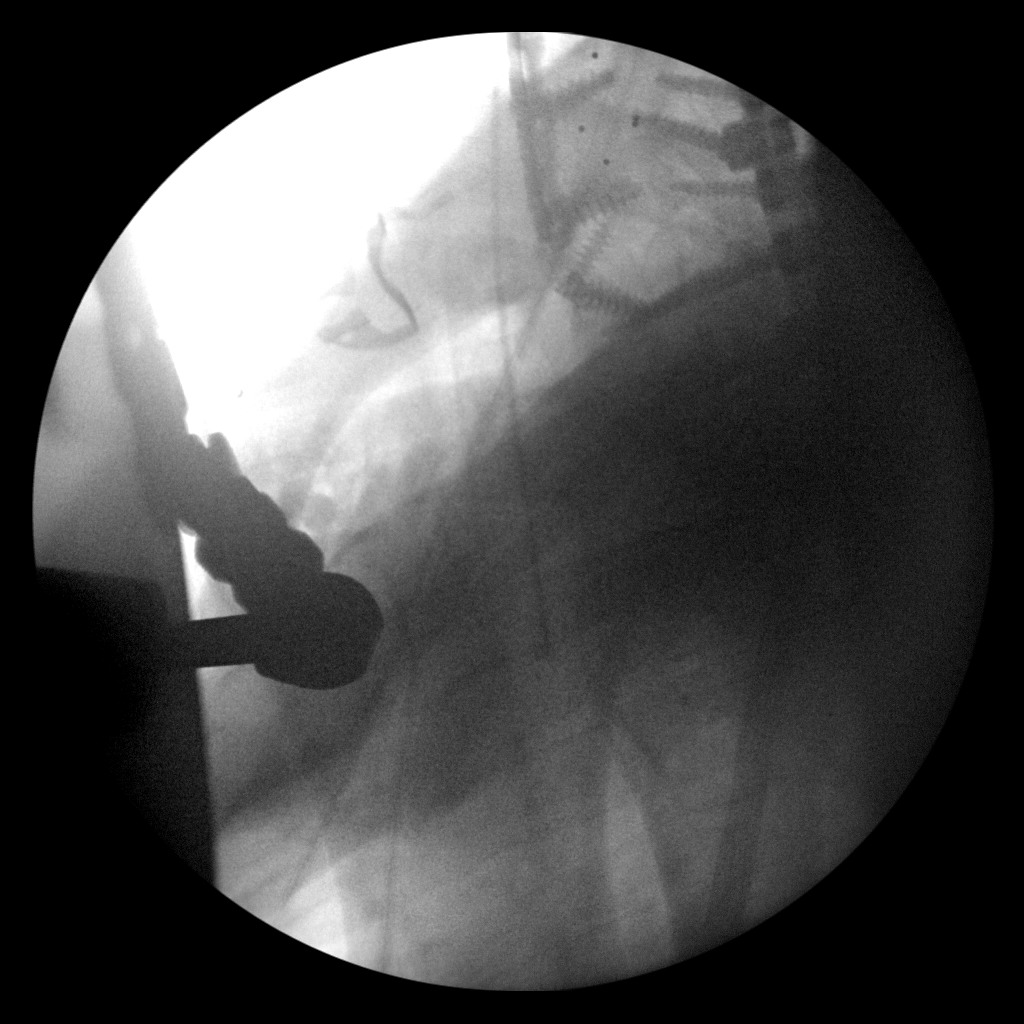

[1 of 1 positions shown; findings below may reference images not displayed]

FINDINGS: Single fluoroscopic intraoperative spot view of the cervical spine
demonstrates new anterior screws at the C6-7 level.
IMPRESSION: C6-7 ACDF in progress.
